# Patient Record
Sex: Male | Born: 1965 | Race: White | Hispanic: No | Smoking: Current some day smoker
Health system: Southern US, Community
[De-identification: ages and names within clinical notes are randomized; demographics above are authoritative.]

## PROBLEM LIST (undated history)

## (undated) DIAGNOSIS — K219 Gastro-esophageal reflux disease without esophagitis: Secondary | ICD-10-CM

## (undated) HISTORY — DX: Gastro-esophageal reflux disease without esophagitis: K21.9

## (undated) HISTORY — PX: LEG SURGERY: SHX1003

---

## 2016-07-15 DIAGNOSIS — Z96659 Presence of unspecified artificial knee joint: Secondary | ICD-10-CM | POA: Diagnosis not present

## 2016-07-15 DIAGNOSIS — T84018A Broken internal joint prosthesis, other site, initial encounter: Secondary | ICD-10-CM | POA: Diagnosis not present

## 2016-10-06 DIAGNOSIS — G8929 Other chronic pain: Secondary | ICD-10-CM | POA: Diagnosis not present

## 2016-10-06 DIAGNOSIS — Q7292 Unspecified reduction defect of left lower limb: Secondary | ICD-10-CM | POA: Diagnosis not present

## 2016-10-06 DIAGNOSIS — M25561 Pain in right knee: Secondary | ICD-10-CM | POA: Diagnosis not present

## 2016-10-06 DIAGNOSIS — M7541 Impingement syndrome of right shoulder: Secondary | ICD-10-CM | POA: Diagnosis not present

## 2016-10-06 DIAGNOSIS — M545 Low back pain: Secondary | ICD-10-CM | POA: Diagnosis not present

## 2016-12-07 DIAGNOSIS — Z23 Encounter for immunization: Secondary | ICD-10-CM | POA: Diagnosis not present

## 2017-10-27 DIAGNOSIS — M62838 Other muscle spasm: Secondary | ICD-10-CM | POA: Diagnosis not present

## 2017-10-27 DIAGNOSIS — M25561 Pain in right knee: Secondary | ICD-10-CM | POA: Diagnosis not present

## 2017-10-27 DIAGNOSIS — M6740 Ganglion, unspecified site: Secondary | ICD-10-CM | POA: Diagnosis not present

## 2017-10-27 DIAGNOSIS — M67432 Ganglion, left wrist: Secondary | ICD-10-CM | POA: Diagnosis not present

## 2017-10-27 DIAGNOSIS — M545 Low back pain: Secondary | ICD-10-CM | POA: Diagnosis not present

## 2017-10-27 DIAGNOSIS — Z7409 Other reduced mobility: Secondary | ICD-10-CM | POA: Diagnosis not present

## 2017-10-27 DIAGNOSIS — G8929 Other chronic pain: Secondary | ICD-10-CM | POA: Diagnosis not present

## 2017-11-17 DIAGNOSIS — M6740 Ganglion, unspecified site: Secondary | ICD-10-CM | POA: Diagnosis not present

## 2017-12-08 DIAGNOSIS — M6740 Ganglion, unspecified site: Secondary | ICD-10-CM | POA: Diagnosis not present

## 2017-12-13 DIAGNOSIS — Z23 Encounter for immunization: Secondary | ICD-10-CM | POA: Diagnosis not present

## 2018-08-29 ENCOUNTER — Other Ambulatory Visit: Payer: Self-pay | Admitting: Sports Medicine

## 2018-08-29 DIAGNOSIS — S83242A Other tear of medial meniscus, current injury, left knee, initial encounter: Secondary | ICD-10-CM | POA: Diagnosis not present

## 2018-08-29 DIAGNOSIS — S8001XA Contusion of right knee, initial encounter: Secondary | ICD-10-CM | POA: Diagnosis not present

## 2018-08-29 DIAGNOSIS — M25562 Pain in left knee: Secondary | ICD-10-CM

## 2018-09-23 ENCOUNTER — Other Ambulatory Visit: Payer: Self-pay

## 2018-12-07 DIAGNOSIS — Z1329 Encounter for screening for other suspected endocrine disorder: Secondary | ICD-10-CM | POA: Diagnosis not present

## 2018-12-07 DIAGNOSIS — I10 Essential (primary) hypertension: Secondary | ICD-10-CM | POA: Diagnosis not present

## 2018-12-07 DIAGNOSIS — Z125 Encounter for screening for malignant neoplasm of prostate: Secondary | ICD-10-CM | POA: Diagnosis not present

## 2018-12-07 DIAGNOSIS — M898X9 Other specified disorders of bone, unspecified site: Secondary | ICD-10-CM | POA: Diagnosis not present

## 2018-12-07 DIAGNOSIS — Z23 Encounter for immunization: Secondary | ICD-10-CM | POA: Diagnosis not present

## 2018-12-07 DIAGNOSIS — Z136 Encounter for screening for cardiovascular disorders: Secondary | ICD-10-CM | POA: Diagnosis not present

## 2018-12-07 DIAGNOSIS — Z131 Encounter for screening for diabetes mellitus: Secondary | ICD-10-CM | POA: Diagnosis not present

## 2018-12-18 DIAGNOSIS — Z0001 Encounter for general adult medical examination with abnormal findings: Secondary | ICD-10-CM | POA: Diagnosis not present

## 2018-12-18 DIAGNOSIS — M898X9 Other specified disorders of bone, unspecified site: Secondary | ICD-10-CM | POA: Diagnosis not present

## 2018-12-18 DIAGNOSIS — Z1389 Encounter for screening for other disorder: Secondary | ICD-10-CM | POA: Diagnosis not present

## 2018-12-18 DIAGNOSIS — I10 Essential (primary) hypertension: Secondary | ICD-10-CM | POA: Diagnosis not present

## 2018-12-26 DIAGNOSIS — I10 Essential (primary) hypertension: Secondary | ICD-10-CM | POA: Diagnosis not present

## 2018-12-26 DIAGNOSIS — M898X9 Other specified disorders of bone, unspecified site: Secondary | ICD-10-CM | POA: Diagnosis not present

## 2019-01-15 DIAGNOSIS — G8929 Other chronic pain: Secondary | ICD-10-CM | POA: Diagnosis not present

## 2019-01-15 DIAGNOSIS — M25561 Pain in right knee: Secondary | ICD-10-CM | POA: Diagnosis not present

## 2019-01-15 DIAGNOSIS — T84032A Mechanical loosening of internal right knee prosthetic joint, initial encounter: Secondary | ICD-10-CM | POA: Diagnosis not present

## 2019-01-15 DIAGNOSIS — T84039A Mechanical loosening of unspecified internal prosthetic joint, initial encounter: Secondary | ICD-10-CM | POA: Diagnosis not present

## 2019-01-16 DIAGNOSIS — T84032A Mechanical loosening of internal right knee prosthetic joint, initial encounter: Secondary | ICD-10-CM | POA: Insufficient documentation

## 2019-01-16 DIAGNOSIS — T84039A Mechanical loosening of unspecified internal prosthetic joint, initial encounter: Secondary | ICD-10-CM | POA: Insufficient documentation

## 2019-01-16 HISTORY — DX: Mechanical loosening of unspecified internal prosthetic joint, initial encounter: T84.039A

## 2019-01-16 HISTORY — DX: Mechanical loosening of internal right knee prosthetic joint, initial encounter: T84.032A

## 2019-03-19 DIAGNOSIS — I10 Essential (primary) hypertension: Secondary | ICD-10-CM | POA: Diagnosis not present

## 2019-03-19 DIAGNOSIS — M898X9 Other specified disorders of bone, unspecified site: Secondary | ICD-10-CM | POA: Diagnosis not present

## 2019-05-21 ENCOUNTER — Emergency Department (HOSPITAL_COMMUNITY)
Admission: EM | Admit: 2019-05-21 | Discharge: 2019-05-21 | Disposition: A | Discharge status: Home / Self Care | Payer: Medicare Other | Attending: Emergency Medicine | Admitting: Emergency Medicine

## 2019-05-21 ENCOUNTER — Encounter (HOSPITAL_COMMUNITY): Payer: Self-pay | Admitting: Emergency Medicine

## 2019-05-21 DIAGNOSIS — M545 Low back pain, unspecified: Secondary | ICD-10-CM

## 2019-05-21 DIAGNOSIS — R531 Weakness: Secondary | ICD-10-CM | POA: Diagnosis not present

## 2019-05-21 DIAGNOSIS — M5489 Other dorsalgia: Secondary | ICD-10-CM | POA: Diagnosis not present

## 2019-05-21 DIAGNOSIS — R52 Pain, unspecified: Secondary | ICD-10-CM | POA: Diagnosis not present

## 2019-05-21 MED ORDER — KETOROLAC TROMETHAMINE 60 MG/2ML IM SOLN
60.0000 mg | Freq: Once | INTRAMUSCULAR | Status: AC
  Administered 2019-05-21: 14:00:00 60 mg via INTRAMUSCULAR
  Filled 2019-05-21: qty 2

## 2019-05-21 MED ORDER — DICLOFENAC SODIUM 75 MG PO TBEC: 75.0000 mg | DELAYED_RELEASE_TABLET | Freq: Two times a day (BID) | ORAL | 0 refills | Status: DC

## 2019-05-21 MED ORDER — LORAZEPAM 2 MG/ML IJ SOLN
1.0000 mg | Freq: Once | INTRAMUSCULAR | Status: AC
  Administered 2019-05-21: 1 mg via INTRAMUSCULAR
  Filled 2019-05-21: qty 1

## 2019-05-21 MED ORDER — METHOCARBAMOL 500 MG PO TABS: 500.0000 mg | ORAL_TABLET | Freq: Four times a day (QID) | ORAL | 0 refills | Status: DC

## 2019-05-21 NOTE — Discharge Instructions (Signed)
Schedule to see the Orthopaedist for evaluation  

## 2019-05-21 NOTE — ED Triage Notes (Signed)
Per EMS, patient from home, reports worsening back pain since yesterday. Reports taking naprosyn with no relief. States using ice pack and pain worsened after ice. Reports weakness to lower legs. Hx chronic back pain.

## 2019-05-21 NOTE — ED Notes (Signed)
Pt verbalizes understanding of DC instructions. Pt belongings returned and is assisted in wheelchair out of ED.    

## 2019-05-21 NOTE — ED Provider Notes (Signed)
[facility] DEPT Provider Note   CSN: [id number] Arrival date & time: 05/21/19  1314     History Chief Complaint  Patient presents with  . Back Pain    Rajvir Ernster is a 54 y.o. male.  The history is provided by the patient. No language interpreter was used.  Back Pain Location:  Lumbar spine Quality:  Aching Radiates to:  Does not radiate Pain severity:  Severe Pain is:  Same all the time Onset quality:  Gradual Timing:  Constant Progression:  Worsening Chronicity:  New Relieved by:  Nothing Worsened by:  Nothing  Pt complains of pain in his back. Pt reports pain with walking     History reviewed. No pertinent past medical history.  There are no problems to display for this patient.   History reviewed. No pertinent surgical history.     No family history on file.  Social History   Tobacco Use  . Smoking status: Not on file  Substance Use Topics  . Alcohol use: Not on file  . Drug use: Not on file    Home Medications Prior to Admission medications   Not on File    Allergies    Patient has no known allergies.  Review of Systems   Review of Systems  Musculoskeletal: Positive for back pain.  All other systems reviewed and are negative.   Physical Exam Updated Vital Signs BP (!) 150/91   Pulse 87   Temp 98.5 F (36.9 C)   Resp 16   SpO2 97%   Physical Exam Vitals and nursing note reviewed.  Constitutional:      Appearance: He is well-developed.  HENT:     Head: Normocephalic and atraumatic.  Eyes:     Conjunctiva/sclera: Conjunctivae normal.  Cardiovascular:     Rate and Rhythm: Normal rate and regular rhythm.     Heart sounds: No murmur.  Pulmonary:     Effort: Pulmonary effort is normal. No respiratory distress.     Breath sounds: Normal breath sounds.  Abdominal:     Palpations: Abdomen is soft.     Tenderness: There is no abdominal tenderness.  Musculoskeletal:     Cervical back: Neck supple.    Skin:    General: Skin is warm and dry.  Neurological:     General: No focal deficit present.     Mental Status: He is alert.  Psychiatric:        Mood and Affect: Mood normal.     ED Results / Procedures / Treatments   Labs (all labs ordered are listed, but only abnormal results are displayed) Labs Reviewed - No data to display  EKG None  Radiology No results found.  Procedures Procedures (including critical care time)  Medications Ordered in ED Medications  ketorolac (TORADOL) injection 60 mg (60 mg Intramuscular Given 05/21/19 1411)  LORazepam (ATIVAN) injection 1 mg (1 mg Intramuscular Given 05/21/19 1411)    ED Course  I have reviewed the triage vital signs and the nursing notes.  Pertinent labs & imaging results that were available during my care of the patient were reviewed by me and considered in my medical decision making (see chart for details).    MDM Rules/Calculators/A&P                      MDM:  Pt feels better after injection of toradol and ativan.   I will treat with Robaxin and voltaren  Final Clinical Impression(s) /  ED Diagnoses Final diagnoses:  Acute low back pain without sciatica, unspecified back pain laterality   An After Visit Summary was printed and given to the patient.  Rx / DC Orders ED Discharge Orders    None       Osie Cheeks 05/21/19 1636    Cathren Laine, MD 05/21/19 1715

## 2019-05-23 DIAGNOSIS — M545 Low back pain: Secondary | ICD-10-CM | POA: Diagnosis not present

## 2019-05-24 ENCOUNTER — Other Ambulatory Visit: Payer: Self-pay | Admitting: Orthopedic Surgery

## 2019-05-24 DIAGNOSIS — M545 Low back pain, unspecified: Secondary | ICD-10-CM

## 2019-06-20 ENCOUNTER — Ambulatory Visit
Admission: RE | Admit: 2019-06-20 | Discharge: 2019-06-20 | Disposition: A | Discharge status: Home / Self Care | Payer: Medicare Other | Source: Ambulatory Visit | Attending: Orthopedic Surgery | Admitting: Orthopedic Surgery

## 2019-06-20 ENCOUNTER — Other Ambulatory Visit: Payer: Self-pay

## 2019-06-20 DIAGNOSIS — M545 Low back pain, unspecified: Secondary | ICD-10-CM

## 2019-06-20 DIAGNOSIS — M48061 Spinal stenosis, lumbar region without neurogenic claudication: Secondary | ICD-10-CM | POA: Diagnosis not present

## 2019-06-27 DIAGNOSIS — M545 Low back pain: Secondary | ICD-10-CM | POA: Diagnosis not present

## 2019-06-27 DIAGNOSIS — M4316 Spondylolisthesis, lumbar region: Secondary | ICD-10-CM | POA: Diagnosis not present

## 2019-11-27 DIAGNOSIS — R0989 Other specified symptoms and signs involving the circulatory and respiratory systems: Secondary | ICD-10-CM | POA: Diagnosis not present

## 2019-11-27 DIAGNOSIS — I10 Essential (primary) hypertension: Secondary | ICD-10-CM | POA: Diagnosis not present

## 2019-11-27 DIAGNOSIS — Z125 Encounter for screening for malignant neoplasm of prostate: Secondary | ICD-10-CM | POA: Diagnosis not present

## 2019-12-18 DIAGNOSIS — Z23 Encounter for immunization: Secondary | ICD-10-CM | POA: Diagnosis not present

## 2019-12-18 DIAGNOSIS — Z0001 Encounter for general adult medical examination with abnormal findings: Secondary | ICD-10-CM | POA: Diagnosis not present

## 2019-12-18 DIAGNOSIS — I10 Essential (primary) hypertension: Secondary | ICD-10-CM | POA: Diagnosis not present

## 2020-04-29 DIAGNOSIS — Z96651 Presence of right artificial knee joint: Secondary | ICD-10-CM

## 2020-04-29 DIAGNOSIS — L309 Dermatitis, unspecified: Secondary | ICD-10-CM | POA: Insufficient documentation

## 2020-04-29 DIAGNOSIS — G8929 Other chronic pain: Secondary | ICD-10-CM | POA: Insufficient documentation

## 2020-04-29 DIAGNOSIS — Z6841 Body Mass Index (BMI) 40.0 and over, adult: Secondary | ICD-10-CM | POA: Diagnosis not present

## 2020-04-29 DIAGNOSIS — M62562 Muscle wasting and atrophy, not elsewhere classified, left lower leg: Secondary | ICD-10-CM | POA: Insufficient documentation

## 2020-04-29 DIAGNOSIS — R042 Hemoptysis: Secondary | ICD-10-CM | POA: Diagnosis not present

## 2020-04-29 DIAGNOSIS — M25561 Pain in right knee: Secondary | ICD-10-CM | POA: Diagnosis not present

## 2020-04-29 DIAGNOSIS — M545 Low back pain, unspecified: Secondary | ICD-10-CM | POA: Diagnosis not present

## 2020-04-29 HISTORY — DX: Dermatitis, unspecified: L30.9

## 2020-04-29 HISTORY — DX: Presence of right artificial knee joint: Z96.651

## 2020-06-28 DIAGNOSIS — I10 Essential (primary) hypertension: Secondary | ICD-10-CM | POA: Insufficient documentation

## 2020-06-28 DIAGNOSIS — G894 Chronic pain syndrome: Secondary | ICD-10-CM | POA: Insufficient documentation

## 2020-07-04 DIAGNOSIS — H6502 Acute serous otitis media, left ear: Secondary | ICD-10-CM | POA: Diagnosis not present

## 2020-07-05 ENCOUNTER — Encounter (HOSPITAL_COMMUNITY): Payer: Self-pay | Admitting: Emergency Medicine

## 2020-07-05 ENCOUNTER — Other Ambulatory Visit: Payer: Self-pay

## 2020-07-05 ENCOUNTER — Emergency Department (HOSPITAL_COMMUNITY): Payer: Medicare HMO

## 2020-07-05 ENCOUNTER — Emergency Department (HOSPITAL_COMMUNITY)
Admission: EM | Admit: 2020-07-05 | Discharge: 2020-07-05 | Disposition: A | Discharge status: Home / Self Care | Payer: Medicare HMO | Attending: Emergency Medicine | Admitting: Emergency Medicine

## 2020-07-05 DIAGNOSIS — J069 Acute upper respiratory infection, unspecified: Secondary | ICD-10-CM | POA: Diagnosis not present

## 2020-07-05 DIAGNOSIS — Z20822 Contact with and (suspected) exposure to covid-19: Secondary | ICD-10-CM | POA: Diagnosis not present

## 2020-07-05 DIAGNOSIS — R059 Cough, unspecified: Secondary | ICD-10-CM | POA: Diagnosis not present

## 2020-07-05 DIAGNOSIS — R9431 Abnormal electrocardiogram [ECG] [EKG]: Secondary | ICD-10-CM | POA: Diagnosis not present

## 2020-07-05 LAB — RESP PANEL BY RT-PCR (FLU A&B, COVID) ARPGX2
Influenza A by PCR: NEGATIVE
Influenza B by PCR: NEGATIVE
SARS Coronavirus 2 by RT PCR: NEGATIVE

## 2020-07-05 MED ORDER — BENZONATATE 100 MG PO CAPS: 100.0000 mg | ORAL_CAPSULE | Freq: Three times a day (TID) | ORAL | 0 refills | Status: DC | PRN

## 2020-07-05 NOTE — ED Notes (Signed)
Pt requested to use the restroom to have a bowel movement, but required assistance to the restroom. Pt refused to use the bedside commode. Pt had work of breathing while traveling down to the restroom and required a wheelchair to get back to the room. The bedside commode was presented for the next instance of a bowel movement. This EMT insured that the call light was in working order upon returning pt to bed.

## 2020-07-05 NOTE — ED Provider Notes (Signed)
[facility] EMERGENCY DEPARTMENT Provider Note   CSN: [id number] Arrival date & time: 07/05/20  0756     History Chief Complaint  Patient presents with  . Cough    Alexander Black is a 55 y.o. male significant past medical history, presenting for evaluation of cough and throat discomfort.  Symptoms began yesterday, he was evaluated at urgent care for serous otitis media.  At that time he.  Strep negative.  COVID swab was not sent.  States he has received vaccinations for COVID-19.  Symptoms include postnasal drip and mucus in his throat that he is having quite a bit of trouble clearing.  He has nasal congestion.  Cough is dry and nonproductive.  He feels when he wakes up he feels like he is choking secretions.  He use the nasal spray and took a dose of the Augmentin without relief, he presents for evaluation.  The history is provided by the patient and medical records.       History reviewed. No pertinent past medical history.  There are no problems to display for this patient.   History reviewed. No pertinent surgical history.     No family history on file.  Social History   Tobacco Use  . Smoking status: Never Smoker  . Smokeless tobacco: Never Used  Substance Use Topics  . Alcohol use: Not Currently  . Drug use: Not Currently    Home Medications Prior to Admission medications   Medication Sig Start Date End Date Taking? Authorizing Provider  benzonatate (TESSALON) 100 MG capsule Take 1 capsule (100 mg total) by mouth 3 (three) times daily as needed for cough. 07/05/20  Yes Marticia Reifschneider, [place] N, PA-C  diclofenac (VOLTAREN) 75 MG EC tablet Take 1 tablet (75 mg total) by mouth 2 (two) times daily. 05/21/19   Elson Areas, PA-C  methocarbamol (ROBAXIN) 500 MG tablet Take 1 tablet (500 mg total) by mouth 4 (four) times daily. 05/21/19   Elson Areas, PA-C    Allergies    Patient has no known allergies.  Review of Systems   Review of Systems   Constitutional: Negative for fever.  HENT: Positive for congestion, postnasal drip and sore throat. Negative for ear discharge and ear pain.   Respiratory: Positive for cough.   All other systems reviewed and are negative.   Physical Exam Updated Vital Signs BP (!) 130/93 (BP Location: Right Arm)   Pulse 88   Temp 98.8 F (37.1 C) (Oral)   Resp 18   SpO2 97%   Physical Exam Vitals and nursing note reviewed.  Constitutional:      General: He is not in acute distress.    Appearance: He is well-developed. He is obese. He is not ill-appearing.  HENT:     Head: Normocephalic and atraumatic.     Right Ear: Tympanic membrane, ear canal and external ear normal.     Left Ear: Tympanic membrane, ear canal and external ear normal.     Nose: Congestion present.     Mouth/Throat:     Mouth: Mucous membranes are moist.     Pharynx: Oropharynx is clear.     Comments: No hot potato voice, no trismus, no drooling.  Mild posterior pharyngeal erythema, no exudates or edema.  Uvula is midline. Eyes:     Conjunctiva/sclera: Conjunctivae normal.  Cardiovascular:     Rate and Rhythm: Normal rate and regular rhythm.  Pulmonary:     Effort: Pulmonary effort is normal. No respiratory distress.  Breath sounds: Normal breath sounds. No stridor.     Comments: Speaking in full sentences with normal work of breathing. Abdominal:     Palpations: Abdomen is soft.  Musculoskeletal:     Cervical back: Normal range of motion and neck supple. No tenderness.  Lymphadenopathy:     Cervical: No cervical adenopathy.  Skin:    General: Skin is warm.  Neurological:     Mental Status: He is alert.  Psychiatric:        Behavior: Behavior normal.     ED Results / Procedures / Treatments   Labs (all labs ordered are listed, but only abnormal results are displayed) Labs Reviewed  RESP PANEL BY RT-PCR (FLU A&B, COVID) [id number]    EKG EKG Interpretation  Date/Time:  Saturday July 05 2020 08:04:31  EDT Ventricular Rate:  93 PR Interval:  146 QRS Duration: 98 QT Interval:  348 QTC Calculation: 432 R Axis:   21 Text Interpretation: Normal sinus rhythm Normal ECG No old tracing to compare Confirmed by Meridee Score [phone]) on 07/05/2020 8:31:50 AM   Radiology DG Chest 2 View  Result Date: 07/05/2020 CLINICAL DATA:  55 year old male with cough. Questionable right lung base opacity on earlier portable chest. EXAM: CHEST - 2 VIEW COMPARISON:  Portable chest 0834 hours today. Chest radiographs 04/29/2020. FINDINGS: Stable lung volumes since February, improved on the lateral today. Normal cardiac size and mediastinal contours. Visualized tracheal air column is within normal limits. Both lungs appear stable since February and clear. No pneumothorax or pleural effusion. Substantial lumbar scoliosis. Straightening of thoracic kyphosis. Negative visible bowel gas pattern. IMPRESSION: 1.  No cardiopulmonary abnormality. 2. Scoliosis. Electronically Signed   By: Odessa Fleming M.D.   On: 07/05/2020 10:33   DG Chest Port 1 View  Result Date: 07/05/2020 CLINICAL DATA:  55 year old with a cough. EXAM: PORTABLE CHEST 1 VIEW COMPARISON:  04/29/2020 FINDINGS: AP portable views of the chest were obtained. Increased densities along the medial right lung base compared to the previous examination and not clear if this is related to patient positioning and projection. Upper lungs are clear. Overall heart size is stable. IMPRESSION: Study has limitations. There may be increased densities at the medial right lung base that are nonspecific. If possible, consider repeat imaging with PA and lateral views of the chest. Electronically Signed   By: Richarda Overlie M.D.   On: 07/05/2020 09:03    Procedures Procedures   Medications Ordered in ED Medications - No data to display  ED Course  I have reviewed the triage vital signs and the nursing notes.  Pertinent labs & imaging results that were available during my care of the  patient were reviewed by me and considered in my medical decision making (see chart for details).    MDM Rules/Calculators/A&P                          Patient here with symptoms consistent with URI.  Feels like the phlegm in his throat is causing him to "suffocate," however he is speaking in full sentences with normal work of breathing, no drooling, pharynx is clear.  Airway is patent.  Provided reassurance, mucus can make 1 feel that way, however it is important to hydrate to help keep patients thin.  Also recommend Mucinex for mucolytic and decongestant.  He is tested for COVID and flu here.  His ENT exam today is overall unremarkable.  His lungs are clear.  Chest x-ray  is clear. His O2 saturation is excellent on room air, at 97%.  His vital signs are reassuring.  He is afebrile.  Provided reassurance, instructed symptomatic management, throat lozenges, or throat sprays, Mucinex, hydration, continue Flonase.  Follow-up if symptoms do not improve.  Discussed results, findings, treatment and follow up. Patient advised of return precautions. Patient verbalized understanding and agreed with plan.  Final Clinical Impression(s) / ED Diagnoses Final diagnoses:  Upper respiratory tract infection, unspecified type    Rx / DC Orders ED Discharge Orders         Ordered    benzonatate (TESSALON) 100 MG capsule  3 times daily PRN        07/05/20 1043           Rayhana Slider, [place] N, [place] 07/05/20 1043    Rolan Bucco, MD 07/05/20 1116

## 2020-07-05 NOTE — ED Triage Notes (Signed)
Pt reports productive cough with clear sputum.  States he was seen yesterday for same and started using nasal spray and taking antibiotic without relief.  Woke up this morning feeling like he was choking and had SOB.  Denies SOB and pain at present.

## 2020-07-05 NOTE — ED Notes (Signed)
Pt returned from X-ray.  

## 2020-07-05 NOTE — ED Notes (Signed)
Pt transported to Xray. 

## 2020-07-05 NOTE — Discharge Instructions (Addendum)
Your chest X ray is normal. Please read instructions below.  You can alternate Tylenol/acetaminophen and Advil/ibuprofen/Motrin every 4 hours for sore throat, body aches, headache or fever.  Drink plenty of water.  Use the  nasal spray for congestion. You can take Tessalon every 8 hours as needed for cough. Take mucinex as directed to help thin your mucus. You can find this over-the-counter. You can also use a throat spray to help with throat irritation. This is over-the-counter as well.  Wash your hands frequently. You have a COVID test pending. Please isolate at home while awaiting your results.  You can follow your results on MyChart. > If your test is negative, stay home until your fever has resolved/your symptoms are improving. > If your test is positive, isolate at home for at least 5 days after the day your symptoms initially began, and THEN at least 24 hours after you are fever-free without the help of medications AND your symptoms are improving. Only once your symptoms are improving and you are fever-free can you come out out of quarantine. Once, then you should wear a mask in public for another 5 days. Follow up with your primary care provider. Return to the ER for significant shortness of breath, uncontrollable vomiting, severe chest pain, or other concerning symptoms.

## 2020-07-13 ENCOUNTER — Ambulatory Visit (INDEPENDENT_AMBULATORY_CARE_PROVIDER_SITE_OTHER): Payer: Medicare HMO

## 2020-07-13 ENCOUNTER — Encounter (HOSPITAL_COMMUNITY): Payer: Self-pay

## 2020-07-13 ENCOUNTER — Ambulatory Visit (INDEPENDENT_AMBULATORY_CARE_PROVIDER_SITE_OTHER)
Admission: EM | Admit: 2020-07-13 | Discharge: 2020-07-13 | Disposition: A | Discharge status: Home / Self Care | Payer: Medicare HMO | Source: Home / Self Care

## 2020-07-13 ENCOUNTER — Other Ambulatory Visit: Payer: Self-pay

## 2020-07-13 DIAGNOSIS — J45909 Unspecified asthma, uncomplicated: Secondary | ICD-10-CM | POA: Diagnosis present

## 2020-07-13 DIAGNOSIS — R058 Other specified cough: Secondary | ICD-10-CM | POA: Insufficient documentation

## 2020-07-13 DIAGNOSIS — Z8249 Family history of ischemic heart disease and other diseases of the circulatory system: Secondary | ICD-10-CM | POA: Diagnosis not present

## 2020-07-13 DIAGNOSIS — J189 Pneumonia, unspecified organism: Secondary | ICD-10-CM | POA: Diagnosis not present

## 2020-07-13 DIAGNOSIS — R1310 Dysphagia, unspecified: Secondary | ICD-10-CM | POA: Diagnosis not present

## 2020-07-13 DIAGNOSIS — Z20822 Contact with and (suspected) exposure to covid-19: Secondary | ICD-10-CM | POA: Diagnosis present

## 2020-07-13 DIAGNOSIS — R0602 Shortness of breath: Secondary | ICD-10-CM | POA: Diagnosis not present

## 2020-07-13 DIAGNOSIS — E876 Hypokalemia: Secondary | ICD-10-CM | POA: Diagnosis present

## 2020-07-13 DIAGNOSIS — Z79899 Other long term (current) drug therapy: Secondary | ICD-10-CM | POA: Diagnosis not present

## 2020-07-13 DIAGNOSIS — F410 Panic disorder [episodic paroxysmal anxiety] without agoraphobia: Secondary | ICD-10-CM | POA: Diagnosis present

## 2020-07-13 DIAGNOSIS — R0989 Other specified symptoms and signs involving the circulatory and respiratory systems: Secondary | ICD-10-CM | POA: Diagnosis not present

## 2020-07-13 DIAGNOSIS — I10 Essential (primary) hypertension: Secondary | ICD-10-CM | POA: Diagnosis not present

## 2020-07-13 DIAGNOSIS — R000 Tachycardia, unspecified: Secondary | ICD-10-CM | POA: Diagnosis not present

## 2020-07-13 DIAGNOSIS — R0600 Dyspnea, unspecified: Secondary | ICD-10-CM | POA: Diagnosis not present

## 2020-07-13 DIAGNOSIS — R059 Cough, unspecified: Secondary | ICD-10-CM

## 2020-07-13 DIAGNOSIS — R1319 Other dysphagia: Secondary | ICD-10-CM | POA: Diagnosis not present

## 2020-07-13 DIAGNOSIS — R1312 Dysphagia, oropharyngeal phase: Secondary | ICD-10-CM | POA: Diagnosis not present

## 2020-07-13 DIAGNOSIS — J9811 Atelectasis: Secondary | ICD-10-CM | POA: Diagnosis present

## 2020-07-13 DIAGNOSIS — R0689 Other abnormalities of breathing: Secondary | ICD-10-CM | POA: Diagnosis not present

## 2020-07-13 DIAGNOSIS — R0902 Hypoxemia: Secondary | ICD-10-CM | POA: Diagnosis not present

## 2020-07-13 DIAGNOSIS — J040 Acute laryngitis: Secondary | ICD-10-CM | POA: Diagnosis present

## 2020-07-13 DIAGNOSIS — J9601 Acute respiratory failure with hypoxia: Secondary | ICD-10-CM | POA: Diagnosis present

## 2020-07-13 DIAGNOSIS — E669 Obesity, unspecified: Secondary | ICD-10-CM | POA: Diagnosis present

## 2020-07-13 LAB — CBC WITH DIFFERENTIAL/PLATELET
Abs Immature Granulocytes: 0.02 10*3/uL (ref 0.00–0.07)
Basophils Absolute: 0 10*3/uL (ref 0.0–0.1)
Basophils Relative: 1 %
Eosinophils Absolute: 0 10*3/uL (ref 0.0–0.5)
Eosinophils Relative: 1 %
HCT: 42.3 % (ref 39.0–52.0)
Hemoglobin: 15 g/dL (ref 13.0–17.0)
Immature Granulocytes: 0 %
Lymphocytes Relative: 41 %
Lymphs Abs: 2 10*3/uL (ref 0.7–4.0)
MCH: 27.4 pg (ref 26.0–34.0)
MCHC: 35.5 g/dL (ref 30.0–36.0)
MCV: 77.3 fL — ABNORMAL LOW (ref 80.0–100.0)
Monocytes Absolute: 0.3 10*3/uL (ref 0.1–1.0)
Monocytes Relative: 6 %
Neutro Abs: 2.6 10*3/uL (ref 1.7–7.7)
Neutrophils Relative %: 51 %
Platelets: 303 10*3/uL (ref 150–400)
RBC: 5.47 MIL/uL (ref 4.22–5.81)
RDW: 14.5 % (ref 11.5–15.5)
WBC: 4.9 10*3/uL (ref 4.0–10.5)
nRBC: 0 % (ref 0.0–0.2)

## 2020-07-13 LAB — COMPREHENSIVE METABOLIC PANEL
ALT: 24 U/L (ref 0–44)
AST: 22 U/L (ref 15–41)
Albumin: 3.8 g/dL (ref 3.5–5.0)
Alkaline Phosphatase: 69 U/L (ref 38–126)
Anion gap: 5 (ref 5–15)
BUN: 8 mg/dL (ref 6–20)
CO2: 25 mmol/L (ref 22–32)
Calcium: 9.2 mg/dL (ref 8.9–10.3)
Chloride: 106 mmol/L (ref 98–111)
Creatinine, Ser: 0.79 mg/dL (ref 0.61–1.24)
GFR, Estimated: >60 mL/min (ref >60)
Glucose, Bld: 107 mg/dL — ABNORMAL HIGH (ref 70–99)
Potassium: 4.3 mmol/L (ref 3.5–5.1)
Sodium: 136 mmol/L (ref 135–145)
Total Bilirubin: 0.7 mg/dL (ref 0.3–1.2)
Total Protein: 7.4 g/dL (ref 6.5–8.1)

## 2020-07-13 MED ORDER — PROMETHAZINE-DM 6.25-15 MG/5ML PO SYRP: 5.0000 mL | ORAL_SOLUTION | Freq: Three times a day (TID) | ORAL | 0 refills | Status: DC | PRN

## 2020-07-13 MED ORDER — CEFUROXIME AXETIL 500 MG PO TABS: 500.0000 mg | ORAL_TABLET | Freq: Two times a day (BID) | ORAL | 0 refills | Status: DC

## 2020-07-13 MED ORDER — DOXYCYCLINE HYCLATE 100 MG PO CAPS: 100.0000 mg | ORAL_CAPSULE | Freq: Two times a day (BID) | ORAL | 0 refills | Status: DC

## 2020-07-13 NOTE — ED Provider Notes (Signed)
[facility]    CSN: [id number] Arrival date & time: 07/13/20  1028      History   Chief Complaint Chief Complaint  Patient presents with  . Diarrhea  . Cough    HPI Alexander Black is a 55 y.o. male.   Patient presents today with a several week history of cough.  He was seen in the emergency room 07/05/2020 at which point chest x-ray was normal and respiratory panel was negative.  He was given amoxicillin and will take last doses of this today.  Despite medication he continues to have significant symptoms.  Reports nocturnal symptoms that wake him up with severe cough and shortness of breath as if something is stuck in the back of his throat.  He denies any fever but does report occasional chills.  Reports some sinus congestion/chest congestion.  Denies any chest pain, palpitations, dizziness, syncope, nausea, vomiting.  He has been taking Mucinex D and Robitussin over-the-counter which improved but did not resolve symptoms.  Reports symptoms are interfering with ability perform daily activities.  He denies history of allergies, asthma, sleep apnea, smoking.     History reviewed. No pertinent past medical history.  There are no problems to display for this patient.   History reviewed. No pertinent surgical history.     Home Medications    Prior to Admission medications   Medication Sig Start Date End Date Taking? Authorizing Provider  cefUROXime (CEFTIN) 500 MG tablet Take 1 tablet (500 mg total) by mouth 2 (two) times daily with a meal. 07/13/20  Yes Evelynne Spiers K, PA-C  doxycycline (VIBRAMYCIN) 100 MG capsule Take 1 capsule (100 mg total) by mouth 2 (two) times daily. 07/13/20  Yes Lakeith Careaga K, PA-C  promethazine-dextromethorphan (PROMETHAZINE-DM) 6.25-15 MG/5ML syrup Take 5 mLs by mouth 3 (three) times daily as needed for cough. 07/13/20  Yes Marlicia Sroka, Noberto Retort, PA-C  pseudoephedrine-guaifenesin (MUCINEX D) 60-600 MG 12 hr tablet Take 1 tablet by mouth every 12 (twelve)  hours.   Yes [provider]  benzonatate (TESSALON) 100 MG capsule Take 1 capsule (100 mg total) by mouth 3 (three) times daily as needed for cough. 07/05/20   Robinson, [place] N, PA-C  diclofenac (VOLTAREN) 75 MG EC tablet Take 1 tablet (75 mg total) by mouth 2 (two) times daily. 05/21/19   Elson Areas, PA-C  methocarbamol (ROBAXIN) 500 MG tablet Take 1 tablet (500 mg total) by mouth 4 (four) times daily. 05/21/19   Elson Areas, PA-C    Family History History reviewed. No pertinent family history.  Social History Social History   Tobacco Use  . Smoking status: Never Smoker  . Smokeless tobacco: Never Used  Substance Use Topics  . Alcohol use: Not Currently  . Drug use: Not Currently     Allergies   Patient has no known allergies.   Review of Systems Review of Systems  Constitutional: Positive for activity change and chills. Negative for appetite change, fatigue and fever.  HENT: Negative for congestion, rhinorrhea, sinus pressure and sinus pain.   Respiratory: Positive for cough, chest tightness and shortness of breath. Negative for wheezing.   Cardiovascular: Negative for chest pain.  Gastrointestinal: Negative for abdominal pain, diarrhea, nausea and vomiting.  Musculoskeletal: Negative for arthralgias and myalgias.  Neurological: Negative for dizziness, light-headedness and headaches.     Physical Exam Triage Vital Signs ED Triage Vitals  Enc Vitals Group     BP 07/13/20 1109 129/84     Pulse Rate  07/13/20 1109 92     Resp 07/13/20 1109 (!) 22     Temp 07/13/20 1109 98.4 F (36.9 C)     Temp Source 07/13/20 1109 Oral     SpO2 07/13/20 1109 95 %     Weight --      Height --      Head Circumference --      Peak Flow --      Pain Score 07/13/20 1107 0     Pain Loc --      Pain Edu? --      Excl. in GC? --    No data found.  Updated Vital Signs BP 129/84 (BP Location: Left Wrist)   Pulse 92   Temp 98.4 F (36.9 C) (Oral)   Resp (!) 22    SpO2 95%   Visual Acuity Right Eye Distance:   Left Eye Distance:   Bilateral Distance:    Right Eye Near:   Left Eye Near:    Bilateral Near:     Physical Exam Vitals reviewed.  Constitutional:      General: He is awake.     Appearance: Normal appearance. He is normal weight. He is not ill-appearing.     Comments: Very pleasant male appears stated age in no acute distress  HENT:     Head: Normocephalic and atraumatic.     Right Ear: Tympanic membrane, ear canal and external ear normal. Tympanic membrane is not erythematous or bulging.     Left Ear: Tympanic membrane, ear canal and external ear normal. Tympanic membrane is not erythematous or bulging.     Nose: Nose normal.     Mouth/Throat:     Pharynx: Uvula midline. No oropharyngeal exudate or posterior oropharyngeal erythema.     Comments: Drainage present posterior oropharynx Cardiovascular:     Rate and Rhythm: Normal rate and regular rhythm.     Heart sounds: No murmur heard.   Pulmonary:     Effort: Pulmonary effort is normal. No accessory muscle usage or respiratory distress.     Breath sounds: Normal breath sounds. No stridor. No wheezing, rhonchi or rales.     Comments: Clear to auscultation bilaterally Abdominal:     General: Bowel sounds are normal.     Palpations: Abdomen is soft.     Tenderness: There is no abdominal tenderness.  Lymphadenopathy:     Head:     Right side of head: No submental, submandibular or tonsillar adenopathy.     Left side of head: No submental, submandibular or tonsillar adenopathy.     Cervical: No cervical adenopathy.  Neurological:     Mental Status: He is alert.  Psychiatric:        Behavior: Behavior is cooperative.      UC Treatments / Results  Labs (all labs ordered are listed, but only abnormal results are displayed) Labs Reviewed  CBC WITH DIFFERENTIAL/PLATELET  COMPREHENSIVE METABOLIC PANEL    EKG   Radiology DG Chest 2 View  Result Date:  07/13/2020 CLINICAL DATA:  Persistent cough and dyspnea for 10 days, on antibiotic therapy EXAM: CHEST - 2 VIEW COMPARISON:  07/05/2020 chest radiograph. FINDINGS: Stable cardiomediastinal silhouette with normal heart size. No pneumothorax. No pleural effusion. No pulmonary edema. Increased mild patchy opacity overlying the lower thoracic spine on the lateral view suggesting lower lobe pneumonia, probably right lower lobe. IMPRESSION: Increased mild patchy opacity overlying the lower thoracic spine on the lateral view, favor mild right lower lobe pneumonia. Chest  radiograph follow-up advised. Electronically Signed   By: Delbert Phenix M.D.   On: 07/13/2020 12:05    Procedures Procedures (including critical care time)  Medications Ordered in UC Medications - No data to display  Initial Impression / Assessment and Plan / UC Course  I have reviewed the triage vital signs and the nursing notes.  Pertinent labs & imaging results that were available during my care of the patient were reviewed by me and considered in my medical decision making (see chart for details).     X-ray showed patchy infiltrates concerning for mild RLL pneumonia.  We will start patient on doxycycline and cefuroxime.  He was prescribed Promethazine DM with instruction not to drive or drink alcohol with this medication as drowsiness is a common side effect.  CBC and CMP obtained today-results pending.  No indication for hospitalization based on vital signs and patient history at this time but discussed if he has any worsening symptoms he is to go to the emergency room.  Discussed the importance of follow-up with primary care including repeat chest x-ray in a few weeks to which patient expressed understanding.  Reports he has a primary care provider and will call them Monday to schedule an appointment.  Strict return precautions given to which patient expressed understanding.  Final Clinical Impressions(s) / UC Diagnoses   Final  diagnoses:  Pneumonia of right lower lobe due to infectious organism  Productive cough     Discharge Instructions     Your x-ray shows a beginning of a possible pneumonia.  Please take doxycycline and cefuroxime twice daily for 10 days.  This can upset your stomach so please make sure you take it with food.  Gargle with warm salt water to help with congestion.  You can use Mucinex.  I have prescribed Promethazine DM for cough and congestion.  This will make you sleepy so please do not drive or drink alcohol while taking this.  It is important that we repeat your chest x-ray in a few weeks so please follow-up with your primary care provider as we discussed.  If you have any worsening symptoms including chest pain, shortness of breath, worsening cough, fever, nausea/vomiting you need to go to the emergency room.    ED Prescriptions    Medication Sig Dispense Auth. Provider   promethazine-dextromethorphan (PROMETHAZINE-DM) 6.25-15 MG/5ML syrup Take 5 mLs by mouth 3 (three) times daily as needed for cough. 118 mL Aleyda Gindlesperger K, PA-C   doxycycline (VIBRAMYCIN) 100 MG capsule Take 1 capsule (100 mg total) by mouth 2 (two) times daily. 20 capsule Lundyn Coste K, PA-C   cefUROXime (CEFTIN) 500 MG tablet Take 1 tablet (500 mg total) by mouth 2 (two) times daily with a meal. 20 tablet Edilia Ghuman K, PA-C     PDMP not reviewed this encounter.   Jeani Hawking, PA-C 07/13/20 1232

## 2020-07-13 NOTE — ED Triage Notes (Signed)
Pt reports cough, chest congestion and diarrhea x 10 days. Robitussin gives relief. Pt reports he is taking amoxicillin, tomorrow ill be lats dose.

## 2020-07-13 NOTE — Discharge Instructions (Signed)
Your x-ray shows a beginning of a possible pneumonia.  Please take doxycycline and cefuroxime twice daily for 10 days.  This can upset your stomach so please make sure you take it with food.  Gargle with warm salt water to help with congestion.  You can use Mucinex.  I have prescribed Promethazine DM for cough and congestion.  This will make you sleepy so please do not drive or drink alcohol while taking this.  It is important that we repeat your chest x-ray in a few weeks so please follow-up with your primary care provider as we discussed.  If you have any worsening symptoms including chest pain, shortness of breath, worsening cough, fever, nausea/vomiting you need to go to the emergency room.

## 2020-07-15 ENCOUNTER — Emergency Department (HOSPITAL_COMMUNITY): Payer: Medicare HMO

## 2020-07-15 ENCOUNTER — Inpatient Hospital Stay (HOSPITAL_COMMUNITY)
Admission: EM | Admit: 2020-07-15 | Discharge: 2020-07-20 | DRG: 189 | Disposition: A | Discharge status: Home / Self Care | Payer: Medicare HMO | Attending: Family Medicine | Admitting: Family Medicine

## 2020-07-15 ENCOUNTER — Encounter (HOSPITAL_COMMUNITY): Payer: Self-pay

## 2020-07-15 ENCOUNTER — Other Ambulatory Visit: Payer: Self-pay

## 2020-07-15 DIAGNOSIS — R1312 Dysphagia, oropharyngeal phase: Secondary | ICD-10-CM | POA: Diagnosis not present

## 2020-07-15 DIAGNOSIS — J9601 Acute respiratory failure with hypoxia: Secondary | ICD-10-CM | POA: Diagnosis present

## 2020-07-15 DIAGNOSIS — J45909 Unspecified asthma, uncomplicated: Secondary | ICD-10-CM | POA: Diagnosis present

## 2020-07-15 DIAGNOSIS — Z20822 Contact with and (suspected) exposure to covid-19: Secondary | ICD-10-CM | POA: Diagnosis present

## 2020-07-15 DIAGNOSIS — J9811 Atelectasis: Secondary | ICD-10-CM | POA: Diagnosis present

## 2020-07-15 DIAGNOSIS — R000 Tachycardia, unspecified: Secondary | ICD-10-CM

## 2020-07-15 DIAGNOSIS — J040 Acute laryngitis: Secondary | ICD-10-CM | POA: Diagnosis present

## 2020-07-15 DIAGNOSIS — R1310 Dysphagia, unspecified: Secondary | ICD-10-CM

## 2020-07-15 DIAGNOSIS — Z8249 Family history of ischemic heart disease and other diseases of the circulatory system: Secondary | ICD-10-CM

## 2020-07-15 DIAGNOSIS — F410 Panic disorder [episodic paroxysmal anxiety] without agoraphobia: Secondary | ICD-10-CM | POA: Diagnosis present

## 2020-07-15 DIAGNOSIS — Z79899 Other long term (current) drug therapy: Secondary | ICD-10-CM

## 2020-07-15 DIAGNOSIS — E876 Hypokalemia: Secondary | ICD-10-CM | POA: Diagnosis present

## 2020-07-15 DIAGNOSIS — E669 Obesity, unspecified: Secondary | ICD-10-CM | POA: Diagnosis present

## 2020-07-15 DIAGNOSIS — R059 Cough, unspecified: Secondary | ICD-10-CM

## 2020-07-15 DIAGNOSIS — R1319 Other dysphagia: Secondary | ICD-10-CM | POA: Diagnosis not present

## 2020-07-15 HISTORY — DX: Acute respiratory failure with hypoxia: J96.01

## 2020-07-15 LAB — CBC WITH DIFFERENTIAL/PLATELET
Abs Immature Granulocytes: 0.04 10*3/uL (ref 0.00–0.07)
Basophils Absolute: 0 10*3/uL (ref 0.0–0.1)
Basophils Relative: 0 %
Eosinophils Absolute: 0.2 10*3/uL (ref 0.0–0.5)
Eosinophils Relative: 2 %
HCT: 42.4 % (ref 39.0–52.0)
Hemoglobin: 14.6 g/dL (ref 13.0–17.0)
Immature Granulocytes: 1 %
Lymphocytes Relative: 53 %
Lymphs Abs: 4.2 10*3/uL — ABNORMAL HIGH (ref 0.7–4.0)
MCH: 27.1 pg (ref 26.0–34.0)
MCHC: 34.4 g/dL (ref 30.0–36.0)
MCV: 78.7 fL — ABNORMAL LOW (ref 80.0–100.0)
Monocytes Absolute: 0.7 10*3/uL (ref 0.1–1.0)
Monocytes Relative: 9 %
Neutro Abs: 2.8 10*3/uL (ref 1.7–7.7)
Neutrophils Relative %: 35 %
Platelets: 282 10*3/uL (ref 150–400)
RBC: 5.39 MIL/uL (ref 4.22–5.81)
RDW: 15.1 % (ref 11.5–15.5)
WBC: 8 10*3/uL (ref 4.0–10.5)
nRBC: 0 % (ref 0.0–0.2)

## 2020-07-15 LAB — BASIC METABOLIC PANEL
Anion gap: 9 (ref 5–15)
BUN: 12 mg/dL (ref 6–20)
CO2: 22 mmol/L (ref 22–32)
Calcium: 8.7 mg/dL — ABNORMAL LOW (ref 8.9–10.3)
Chloride: 101 mmol/L (ref 98–111)
Creatinine, Ser: 0.72 mg/dL (ref 0.61–1.24)
GFR, Estimated: >60 mL/min (ref >60)
Glucose, Bld: 117 mg/dL — ABNORMAL HIGH (ref 70–99)
Potassium: 3.3 mmol/L — ABNORMAL LOW (ref 3.5–5.1)
Sodium: 132 mmol/L — ABNORMAL LOW (ref 135–145)

## 2020-07-15 LAB — RESP PANEL BY RT-PCR (FLU A&B, COVID) ARPGX2
Influenza A by PCR: NEGATIVE
Influenza B by PCR: NEGATIVE
SARS Coronavirus 2 by RT PCR: NEGATIVE

## 2020-07-15 LAB — LACTIC ACID, PLASMA
Lactic Acid, Venous: 1.7 mmol/L (ref 0.5–1.9)
Lactic Acid, Venous: 3 mmol/L (ref 0.5–1.9)

## 2020-07-15 LAB — MAGNESIUM: Magnesium: 2 mg/dL (ref 1.7–2.4)

## 2020-07-15 LAB — BRAIN NATRIURETIC PEPTIDE: B Natriuretic Peptide: 17.6 pg/mL (ref 0.0–100.0)

## 2020-07-15 LAB — TROPONIN I (HIGH SENSITIVITY)
Troponin I (High Sensitivity): 2 ng/L (ref <18)
Troponin I (High Sensitivity): 2 ng/L (ref <18)

## 2020-07-15 LAB — D-DIMER, QUANTITATIVE: D-Dimer, Quant: 0.51 ug/mL-FEU — ABNORMAL HIGH (ref 0.00–0.50)

## 2020-07-15 MED ORDER — SODIUM CHLORIDE 0.9 % IV BOLUS
1000.0000 mL | Freq: Once | INTRAVENOUS | Status: AC
  Administered 2020-07-15: 1000 mL via INTRAVENOUS

## 2020-07-15 MED ORDER — DEXTROMETHORPHAN POLISTIREX ER 30 MG/5ML PO SUER
30.0000 mg | Freq: Two times a day (BID) | ORAL | Status: DC
  Administered 2020-07-15 – 2020-07-16 (×3): 30 mg via ORAL
  Filled 2020-07-15 (×3): qty 5

## 2020-07-15 MED ORDER — FLUTICASONE PROPIONATE 50 MCG/ACT NA SUSP
1.0000 | Freq: Every day | NASAL | Status: DC
  Administered 2020-07-15 – 2020-07-18 (×3): 1 via NASAL
  Filled 2020-07-15: qty 16

## 2020-07-15 MED ORDER — IPRATROPIUM-ALBUTEROL 0.5-2.5 (3) MG/3ML IN SOLN
3.0000 mL | Freq: Once | RESPIRATORY_TRACT | Status: AC
  Administered 2020-07-15: 3 mL via RESPIRATORY_TRACT
  Filled 2020-07-15: qty 3

## 2020-07-15 MED ORDER — IPRATROPIUM-ALBUTEROL 0.5-2.5 (3) MG/3ML IN SOLN
3.0000 mL | Freq: Four times a day (QID) | RESPIRATORY_TRACT | Status: DC
  Administered 2020-07-15 – 2020-07-16 (×5): 3 mL via RESPIRATORY_TRACT
  Filled 2020-07-15 (×4): qty 3

## 2020-07-15 MED ORDER — HEPARIN SODIUM (PORCINE) 5000 UNIT/ML IJ SOLN
5000.0000 [IU] | Freq: Three times a day (TID) | INTRAMUSCULAR | Status: DC
  Administered 2020-07-15 – 2020-07-17 (×8): 5000 [IU] via SUBCUTANEOUS
  Filled 2020-07-15 (×8): qty 1

## 2020-07-15 MED ORDER — IOHEXOL 350 MG/ML SOLN
100.0000 mL | Freq: Once | INTRAVENOUS | Status: AC | PRN
  Administered 2020-07-15: 100 mL via INTRAVENOUS

## 2020-07-15 MED ORDER — METHYLPREDNISOLONE SODIUM SUCC 40 MG IJ SOLR
40.0000 mg | Freq: Two times a day (BID) | INTRAMUSCULAR | Status: DC
  Administered 2020-07-15 – 2020-07-16 (×2): 40 mg via INTRAVENOUS
  Filled 2020-07-15 (×2): qty 1

## 2020-07-15 NOTE — ED Notes (Signed)
Pulse went up to 170 while ambulating, pt states his body is shaking afterwards.

## 2020-07-15 NOTE — ED Provider Notes (Signed)
[facility]-EMERGENCY DEPT Provider Note   CSN: [id number] Arrival date & time: 07/15/20  0944    History Chief Complaint  Patient presents with  . Shortness of Breath    Alexander Black is a 55 y.o. male with past medical history significant for remote left lower extremity surgery who presents for evaluation of cough and shortness of breath.  This is his third visit for similar complaints.  Has been on multiple antibiotics without relief. States his symptoms have worsened since they started 10 days ago.  Patient stated this morning he felt he could not take a deep breath. On EMS arrival patient was not moving much air.  Subsequently given albuterol, Atrovent and steroids.  Patient denies any PND or orthopnea.  No history of asthma, COPD, CHF.  Denies any unilateral leg swelling, redness or warmth.  Does feel like he has some tightness across his chest.  Does not radiating to left arm, left back and jaw.  He has had chills however has not taken his temperature.  He is vaccinated and boosted for COVID.  States he has had 2 outpatient COVID test which were negative.  No headache, lightheadedness, dizziness, abdominal pain, diarrhea, dysuria, paresthesias or weakness.  He does feel like he has sommuscous in his throat like he cannot cough this up.  He states he is able to cough it is clear mucus.  Able to tolerate p.o. intake at home without difficulty.  Mild associated rhinorrhea.  Denies additional aggravating or alleviating factors.  History obtained from patient and past medical records.  No interpreter used.  Chart review>>  07/04/20>> UC visit (ST, cough) started on Augmentin  07/05/20>> ED visit (Cough) Chest xray clear >>continue home Abx  07/13/20>> UC visit (Cough, SOB) Chest xray with right LL PNA>> Dc home with Doxy, Ceftin  07/15/20>> Virtual visit with PCP>> Told told come to ED for worsening SX, Ems called   HPI     History reviewed. No pertinent past medical  history.  There are no problems to display for this patient.   Past Surgical History:  Procedure Laterality Date  . LEG SURGERY         History reviewed. No pertinent family history.  Social History   Tobacco Use  . Smoking status: Never Smoker  . Smokeless tobacco: Never Used  Substance Use Topics  . Alcohol use: Not Currently  . Drug use: Not Currently    Home Medications Prior to Admission medications   Medication Sig Start Date End Date Taking? Authorizing Provider  benzonatate (TESSALON) 100 MG capsule Take 1 capsule (100 mg total) by mouth 3 (three) times daily as needed for cough. 07/05/20   Robinson, [place] N, PA-C  cefUROXime (CEFTIN) 500 MG tablet Take 1 tablet (500 mg total) by mouth 2 (two) times daily with a meal. 07/13/20   Raspet, Noberto Retort, PA-C  diclofenac (VOLTAREN) 75 MG EC tablet Take 1 tablet (75 mg total) by mouth 2 (two) times daily. 05/21/19   Elson Areas, PA-C  doxycycline (VIBRAMYCIN) 100 MG capsule Take 1 capsule (100 mg total) by mouth 2 (two) times daily. 07/13/20   Raspet, Noberto Retort, PA-C  methocarbamol (ROBAXIN) 500 MG tablet Take 1 tablet (500 mg total) by mouth 4 (four) times daily. 05/21/19   Elson Areas, PA-C  promethazine-dextromethorphan (PROMETHAZINE-DM) 6.25-15 MG/5ML syrup Take 5 mLs by mouth 3 (three) times daily as needed for cough. 07/13/20   Raspet, Noberto Retort, PA-C  pseudoephedrine-guaifenesin (MUCINEX D) 60-600  MG 12 hr tablet Take 1 tablet by mouth every 12 (twelve) hours.    [provider]    Allergies    Patient has no known allergies.  Review of Systems   Review of Systems  Constitutional: Negative.   HENT: Positive for congestion and rhinorrhea. Negative for trouble swallowing and voice change.   Respiratory: Positive for cough, chest tightness and shortness of breath. Negative for apnea, choking, wheezing and stridor.   Cardiovascular: Negative.   Gastrointestinal: Negative.   Genitourinary: Negative.   Musculoskeletal:  Negative.   Skin: Negative.   Neurological: Positive for weakness (Generalized). Negative for dizziness, tremors, seizures, syncope and headaches.  All other systems reviewed and are negative.   Physical Exam Updated Vital Signs BP (!) 145/81 (BP Location: Right Arm)   Pulse (!) 112   Temp 97.9 F (36.6 C) (Oral)   Resp 19   SpO2 (!) 89%   Physical Exam Vitals and nursing note reviewed.  Constitutional:      General: He is not in acute distress.    Appearance: He is well-developed. He is obese. He is ill-appearing. He is not toxic-appearing or diaphoretic.  HENT:     Head: Normocephalic and atraumatic.     Mouth/Throat:     Pharynx: Oropharynx is clear.  Eyes:     Pupils: Pupils are equal, round, and reactive to light.  Cardiovascular:     Rate and Rhythm: Regular rhythm. Tachycardia present.     Pulses: Normal pulses.     Heart sounds: Normal heart sounds.     Comments: Heart rate 120s in room Pulmonary:     Effort: Pulmonary effort is normal. Tachypnea present. No respiratory distress.     Breath sounds: Decreased breath sounds present.     Comments: Decreased breath sounds bilaterally.  Tachypnea, breathing approximately 30 times a minute.  Oxygen mid 90s Abdominal:     General: Bowel sounds are normal. There is no distension.     Palpations: Abdomen is soft.     Comments: Soft, nontender  Musculoskeletal:        General: Normal range of motion.     Cervical back: Normal range of motion and neck supple.     Right lower leg: No tenderness. No edema.     Left lower leg: No tenderness. No edema.     Comments: Brace to left lower extremity.  Right lower extremity greater than left however patient states chronic due to prior left lower extremity.  Bony tenderness.  Compartments are  Skin:    General: Skin is warm and dry.     Capillary Refill: Capillary refill takes less than 2 seconds.     Comments: No rashes or lesions  Neurological:     General: No focal deficit  present.     Mental Status: He is alert and oriented to person, place, and time.     ED Results / Procedures / Treatments   Labs (all labs ordered are listed, but only abnormal results are displayed) Labs Reviewed  CBC WITH DIFFERENTIAL/PLATELET - Abnormal; Notable for the following components:      Result Value   MCV 78.7 (*)    Lymphs Abs 4.2 (*)    All other components within normal limits  BASIC METABOLIC PANEL - Abnormal; Notable for the following components:   Sodium 132 (*)    Potassium 3.3 (*)    Glucose, Bld 117 (*)    Calcium 8.7 (*)    All other components  within normal limits  LACTIC ACID, PLASMA - Abnormal; Notable for the following components:   Lactic Acid, Venous 3.0 (*)    All other components within normal limits  D-DIMER, QUANTITATIVE - Abnormal; Notable for the following components:   D-Dimer, Quant 0.51 (*)    All other components within normal limits  RESP PANEL BY RT-PCR (FLU A&B, COVID) [id number]  CULTURE, BLOOD (ROUTINE X 2)  CULTURE, BLOOD (ROUTINE X 2)  LACTIC ACID, PLASMA  BRAIN NATRIURETIC PEPTIDE  TROPONIN I (HIGH SENSITIVITY)  TROPONIN I (HIGH SENSITIVITY)    EKG None  Radiology CT Angio Chest PE W/Cm &/Or Wo Cm  Result Date: 07/15/2020 CLINICAL DATA:  Shortness of breath EXAM: CT ANGIOGRAPHY CHEST WITH CONTRAST TECHNIQUE: Multidetector CT imaging of the chest was performed using the standard protocol during bolus administration of intravenous contrast. Multiplanar CT image reconstructions and MIPs were obtained to evaluate the vascular anatomy. CONTRAST:  100mL OMNIPAQUE IOHEXOL 350 MG/ML SOLN COMPARISON:  None. FINDINGS: Cardiovascular: No filling defects in the pulmonary arteries to suggest pulmonary emboli. Heart is normal size. Aorta is normal caliber. Mediastinum/Nodes: No mediastinal, hilar, or axillary adenopathy. Trachea and esophagus are unremarkable. Thyroid unremarkable. Lungs/Pleura: Lungs are clear. No focal airspace opacities or  suspicious nodules. No effusions. Upper Abdomen: Imaging into the upper abdomen demonstrates no acute findings. Musculoskeletal: Chest wall soft tissues are unremarkable. No acute bony abnormality. Review of the MIP images confirms the above findings. IMPRESSION: No evidence of pulmonary embolus. No acute cardiopulmonary disease. Electronically Signed   By: Charlett NoseKevin  Dover M.D.   On: 07/15/2020 12:13   DG Chest Portable 1 View  Result Date: 07/15/2020 CLINICAL DATA:  Shortness of breath, cough. EXAM: PORTABLE CHEST 1 VIEW COMPARISON:  Chest radiograph dated 07/13/2020. FINDINGS: The heart size and mediastinal contours are within normal limits. There is suggestion of mild bibasilar atelectasis/airspace disease. No pleural effusion or pneumothorax. The visualized skeletal structures are unremarkable. IMPRESSION: Suggestion of mild bibasilar atelectasis/airspace disease. Electronically Signed   By: Romona Curlsyler  Litton M.D.   On: 07/15/2020 11:23   Procedures Procedures   Medications Ordered in ED Medications  sodium chloride 0.9 % bolus 1,000 mL (has no administration in time range)  sodium chloride 0.9 % bolus 1,000 mL (1,000 mLs Intravenous New Bag/Given 07/15/20 1027)  iohexol (OMNIPAQUE) 350 MG/ML injection 100 mL (100 mLs Intravenous Contrast Given 07/15/20 1145)  ipratropium-albuterol (DUONEB) 0.5-2.5 (3) MG/3ML nebulizer solution 3 mL (3 mLs Nebulization Given 07/15/20 1241)    ED Course  I have reviewed the triage vital signs and the nursing notes.  Pertinent labs & imaging results that were available during my care of the patient were reviewed by me and considered in my medical decision making (see chart for details).  55 year old here for evaluation of cough and shortness of breath.  He is afebrile, nonseptic appearing however does appear ill.  This is third visit for similar complaints.  He has been on 2 rounds of outpatient antibiotics with worsening of symptoms.  He has had 2 negative COVID test  and is vaccinated and boosted as well.  His heart is clear however he is tachycardic into the 120s.  He has decreased breath sounds bilaterally and is tachypneic into the 30s.  Subsequently placed on 2 L of oxygen for comfort.  When seen by EMS he was given DuoNebs, albuterol as well as 125 Solu-Medrol.  He does not appear clinically fluid overloaded.  His abdomen is soft, nontender.  His compartments are soft.  His  right lower extremity is greater than left her patient states this is chronic due to prior left lower extremity surgery.  No prior history of PE or DVTs.  No rashes or lesions.  Plan on labs, imaging and reassess  Labs and imaging personally reviewed and interpreted:  CBC without leukocytosis  BMP sodium 132, potassium 3.3, glucose 117, no additional electrolyte, renal or liver abnormality lactic acid 1.7>>3.0 D-dimer 0.51 CTA chest without acute abnormality Trop 2>>2 BNP 17.6 COVID, flu negative DG chest without acute abnormality EKG sinus tacycardia   Nursing went to ambulate patient.  He became severely tachycardic into the 170s, tachypneic into the 40s, lightheaded, shaky. Dropped oxygen to 89% on RA. Placed back on 2 L [place].  Unclear etiology of patient's symptoms, question viral illness, will admit for further management of acute hypoxic respiratory failure  CONSULT with Dr. Gwenlyn Perking with [facility] who will evaluate patient for admission.  The patient appears reasonably stabilized for admission considering the current resources, flow, and capabilities available in the ED at this time, and I doubt any other [facility] requiring further screening and/or treatment in the ED prior to admission.  Discussed with attending, Dr. Estell Harpin agrees above treatment, plan and disposition     MDM Rules/Calculators/A&P                          Alexander Black was evaluated in Emergency Department on 07/15/2020 for the symptoms described in the history of present illness. He was evaluated in the context of  the global COVID-19 pandemic, which necessitated consideration that the patient might be at risk for infection with the SARS-CoV-2 virus that causes COVID-19. Institutional protocols and algorithms that pertain to the evaluation of patients at risk for COVID-19 are in a state of rapid change based on information released by regulatory bodies including the CDC and federal and state organizations. These policies and algorithms were followed during the patient's care in the ED. Final Clinical Impression(s) / ED Diagnoses Final diagnoses:  Acute respiratory failure with hypoxia (HCC)  Cough  Tachycardia    Rx / DC Orders ED Discharge Orders    None       Shota Kohrs A, PA-C 07/15/20 1535    Bethann Berkshire, MD 07/17/20 [phone]

## 2020-07-15 NOTE — ED Notes (Signed)
Lactic 3.0  Reported to B.Henderly

## 2020-07-15 NOTE — ED Notes (Signed)
After using urinal, pt spo2 down to 89%. Placed back on o2

## 2020-07-15 NOTE — ED Triage Notes (Addendum)
Pt arrived via EMS from home, SOB, cough, congestion. Has been tested for COVID, negative result. Has been treated with abx, finished course, no improvement.  Given en route 18 G L hand 10 mg albuterol  0.5 atrovent  125mg  solumedrol

## 2020-07-16 DIAGNOSIS — J9601 Acute respiratory failure with hypoxia: Secondary | ICD-10-CM | POA: Diagnosis not present

## 2020-07-16 LAB — CBC
HCT: 36.3 % — ABNORMAL LOW (ref 39.0–52.0)
Hemoglobin: 13 g/dL (ref 13.0–17.0)
MCH: 27.3 pg (ref 26.0–34.0)
MCHC: 35.8 g/dL (ref 30.0–36.0)
MCV: 76.3 fL — ABNORMAL LOW (ref 80.0–100.0)
Platelets: 266 10*3/uL (ref 150–400)
RBC: 4.76 MIL/uL (ref 4.22–5.81)
RDW: 14.9 % (ref 11.5–15.5)
WBC: 7.3 10*3/uL (ref 4.0–10.5)
nRBC: 0 % (ref 0.0–0.2)

## 2020-07-16 LAB — COMPREHENSIVE METABOLIC PANEL
ALT: 23 U/L (ref 0–44)
AST: 18 U/L (ref 15–41)
Albumin: 3.5 g/dL (ref 3.5–5.0)
Alkaline Phosphatase: 64 U/L (ref 38–126)
Anion gap: 6 (ref 5–15)
BUN: 11 mg/dL (ref 6–20)
CO2: 25 mmol/L (ref 22–32)
Calcium: 8.7 mg/dL — ABNORMAL LOW (ref 8.9–10.3)
Chloride: 106 mmol/L (ref 98–111)
Creatinine, Ser: 0.72 mg/dL (ref 0.61–1.24)
GFR, Estimated: >60 mL/min (ref >60)
Glucose, Bld: 144 mg/dL — ABNORMAL HIGH (ref 70–99)
Potassium: 4.2 mmol/L (ref 3.5–5.1)
Sodium: 137 mmol/L (ref 135–145)
Total Bilirubin: 0.5 mg/dL (ref 0.3–1.2)
Total Protein: 6.6 g/dL (ref 6.5–8.1)

## 2020-07-16 LAB — HIV ANTIBODY (ROUTINE TESTING W REFLEX): HIV Screen 4th Generation wRfx: NONREACTIVE

## 2020-07-16 MED ORDER — ONDANSETRON HCL 4 MG PO TABS: 4.0000 mg | ORAL_TABLET | Freq: Four times a day (QID) | ORAL | Status: DC | PRN

## 2020-07-16 MED ORDER — LORATADINE 10 MG PO TABS
10.0000 mg | ORAL_TABLET | Freq: Every day | ORAL | Status: DC
  Administered 2020-07-16 – 2020-07-20 (×5): 10 mg via ORAL
  Filled 2020-07-16 (×5): qty 1

## 2020-07-16 MED ORDER — ACETAMINOPHEN 650 MG RE SUPP: 650.0000 mg | Freq: Four times a day (QID) | RECTAL | Status: DC | PRN

## 2020-07-16 MED ORDER — GUAIFENESIN-DM 100-10 MG/5ML PO SYRP
5.0000 mL | ORAL_SOLUTION | ORAL | Status: DC | PRN
  Administered 2020-07-17 – 2020-07-20 (×6): 5 mL via ORAL
  Filled 2020-07-16 (×6): qty 10

## 2020-07-16 MED ORDER — PANTOPRAZOLE SODIUM 40 MG PO TBEC
40.0000 mg | DELAYED_RELEASE_TABLET | Freq: Every day | ORAL | Status: DC
  Administered 2020-07-16 – 2020-07-20 (×5): 40 mg via ORAL
  Filled 2020-07-16 (×5): qty 1

## 2020-07-16 MED ORDER — POLYETHYLENE GLYCOL 3350 17 G PO PACK
17.0000 g | PACK | Freq: Every day | ORAL | Status: DC
  Administered 2020-07-16 – 2020-07-18 (×2): 17 g via ORAL
  Filled 2020-07-16 (×4): qty 1

## 2020-07-16 MED ORDER — POTASSIUM CHLORIDE CRYS ER 20 MEQ PO TBCR
40.0000 meq | EXTENDED_RELEASE_TABLET | Freq: Once | ORAL | Status: AC
  Administered 2020-07-16: 40 meq via ORAL
  Filled 2020-07-16: qty 2

## 2020-07-16 MED ORDER — PREDNISONE 20 MG PO TABS
40.0000 mg | ORAL_TABLET | Freq: Every day | ORAL | Status: DC
  Administered 2020-07-17 – 2020-07-18 (×2): 40 mg via ORAL
  Filled 2020-07-16 (×2): qty 2

## 2020-07-16 MED ORDER — ACETAMINOPHEN 325 MG PO TABS
650.0000 mg | ORAL_TABLET | Freq: Four times a day (QID) | ORAL | Status: DC | PRN
  Administered 2020-07-18: 650 mg via ORAL
  Filled 2020-07-16: qty 2

## 2020-07-16 MED ORDER — ONDANSETRON HCL 4 MG/2ML IJ SOLN: 4.0000 mg | Freq: Four times a day (QID) | INTRAMUSCULAR | Status: DC | PRN

## 2020-07-16 MED ORDER — IPRATROPIUM-ALBUTEROL 0.5-2.5 (3) MG/3ML IN SOLN
3.0000 mL | Freq: Three times a day (TID) | RESPIRATORY_TRACT | Status: DC
  Administered 2020-07-16 – 2020-07-18 (×5): 3 mL via RESPIRATORY_TRACT
  Filled 2020-07-16 (×5): qty 3

## 2020-07-16 NOTE — Progress Notes (Signed)
PROGRESS NOTE    Yochanan Eddleman  VFI:[id number] DOB: 1965-11-23 DOA: 07/15/2020 PCP: Patient, No Pcp Per (Inactive)    Brief Narrative:  Mr. Barletta was admitted to the hospital with acute hypoxic respiratory failure due to reactive airway disease.  55 year old male past medical history for obesity, osteoarthritis who presented with dyspnea.  Reported 10 days of dyspnea, associated with productive cough.  Patient was treated as an outpatient with antibiotics with no resolution of his symptoms.  On his initial physical examination blood pressure 135/70, heart rate 109, respirate 19, oxygen saturation 95%, temperature 98, lungs with rhonchi but no wheezing, heart S1-S2, present, rhythmic, soft abdomen, no lower extremity edema.  Sodium 132, potassium 3.3, chloride 1 1, bicarb 22, glucose 117, BUN 12, creatinine 0.72, white count 8.0, hemoglobin 14.6, hematocrit 42.4, platelets 282. SARS COVID-19 negative.  Chest radiograph with bibasilar faint atelectasis CT chest negative for pulmonary embolism.  No infiltrates.  EKG 122 bpm, left axis deviation, normal intervals, sinus rhythm, no ST segment or T wave changes.  Assessment & Plan:   Active Problems:   Acute respiratory failure with hypoxia (HCC)   1.  Hyperactive airway patient continue to have cough but improving dyspnea, no wheezing, no chest pain. No dysphagia.  Continue with bronchodilator therapy and antihistamines. Add pantoprazole  Short course of steroids.   Patient will need outpatient PFT.   Possible dc home in am,  2.  Hypokalemia stable renal function with serum cr at 0.72, K is 4,2 and bicarbonate is 25. Follow up renal function as outpatient   3.  Obesity. Will need outpatient follow up      Patient continue to be at high risk for   Status is: Inpatient  Remains inpatient appropriate because:Inpatient level of care appropriate due to severity of illness   Dispo: The patient is from: Home               Anticipated d/c is to: Home              Patient currently is not medically stable to d/c.   Difficult to place patient No    DVT prophylaxis: Enoxaparin   Code Status:   full  Family Communication:  No family at the bedside      Subjective: Patient is feeling better, continue to have cough, no nausea or vomiting, no wheezing, dyspnea is improving   Objective: Vitals:   07/15/20 2321 07/16/20 0121 07/16/20 0408 07/16/20 0729  BP: 116/67  103/72   Pulse: 91  85   Resp: 18  18   Temp: 98 F (36.7 C)  (!) 97.3 F (36.3 C)   TempSrc: Oral  Oral   SpO2: 93% 94% 91% 93%    Intake/Output Summary (Last 24 hours) at 07/16/2020 1327 Last data filed at 07/15/2020 1357 Gross per 24 hour  Intake 1000 ml  Output --  Net 1000 ml   There were no vitals filed for this visit.  Examination:   General: Not in pain or dyspnea,.  Neurology: Awake and alert, non focal  E ENT: no pallor, no icterus, oral mucosa moist Cardiovascular: No JVD. S1-S2 present, rhythmic, no gallops, rubs, or murmurs. No lower extremity edema. Pulmonary:  Positive breath sounds bilaterally, with no wheezing, rhonchi or rales. Gastrointestinal. Abdomen soft and non tender Skin. No rashes Musculoskeletal: no joint deformities     Data Reviewed: I have personally reviewed following labs and imaging studies  CBC: Recent Labs  Lab 07/13/20 1141 07/15/20 1008  07/16/20 0537  WBC 4.9 8.0 7.3  NEUTROABS 2.6 2.8  --   HGB 15.0 14.6 13.0  HCT 42.3 42.4 36.3*  MCV 77.3* 78.7* 76.3*  PLT 303 282 266   Basic Metabolic Panel: Recent Labs  Lab 07/13/20 1141 07/15/20 1008 07/15/20 1522 07/16/20 0537  NA 136 132*  --  137  K 4.3 3.3*  --  4.2  CL 106 101  --  106  CO2 25 22  --  25  GLUCOSE 107* 117*  --  144*  BUN 8 12  --  11  CREATININE 0.79 0.72  --  0.72  CALCIUM 9.2 8.7*  --  8.7*  MG  --   --  2.0  --    GFR: CrCl cannot be calculated (Unknown ideal weight.). Liver Function Tests: Recent  Labs  Lab 07/13/20 1141 07/16/20 0537  AST 22 18  ALT 24 23  ALKPHOS 69 64  BILITOT 0.7 0.5  PROT 7.4 6.6  ALBUMIN 3.8 3.5   No results for input(s): LIPASE, AMYLASE in the last 168 hours. No results for input(s): AMMONIA in the last 168 hours. Coagulation Profile: No results for input(s): INR, PROTIME in the last 168 hours. Cardiac Enzymes: No results for input(s): CKTOTAL, CKMB, CKMBINDEX, TROPONINI in the last 168 hours. BNP (last 3 results) No results for input(s): PROBNP in the last 8760 hours. HbA1C: No results for input(s): HGBA1C in the last 72 hours. CBG: No results for input(s): GLUCAP in the last 168 hours. Lipid Profile: No results for input(s): CHOL, HDL, LDLCALC, TRIG, CHOLHDL, LDLDIRECT in the last 72 hours. Thyroid Function Tests: No results for input(s): TSH, T4TOTAL, FREET4, T3FREE, THYROIDAB in the last 72 hours. Anemia Panel: No results for input(s): VITAMINB12, FOLATE, FERRITIN, TIBC, IRON, RETICCTPCT in the last 72 hours.    Radiology Studies: I have reviewed all of the imaging during this hospital visit personally     Scheduled Meds: . dextromethorphan  30 mg Oral BID  . fluticasone  1 spray Each Nare Daily  . heparin injection (subcutaneous)  5,000 Units Subcutaneous Q8H  . ipratropium-albuterol  3 mL Nebulization Q6H  . methylPREDNISolone (SOLU-MEDROL) injection  40 mg Intravenous Q12H  . pantoprazole  40 mg Oral Q1200   Continuous Infusions:   LOS: 1 day        Iniya Matzek Annett Gula, MD

## 2020-07-16 NOTE — H&P (Signed)
History and Physical    Alexander Black [id number]:[id number] DOB: [birth year] DOA: 07/15/2020  PCP: Patient, No Pcp Per (Inactive)   Patient coming from: home  I have personally briefly reviewed patient's old medical records in [facility]  Chief Complaint: SOB, coughing spells, congestion (nasal/throat)   HPI: Alexander Black is a 55 y.o. male with medical history significant of obesity, OA (s/p knee replacement) and obesity; who presented to ED with complaints of SOB, intermittent clear sputum productive cough and increased nasal congestion. No fever, no hemoptysis. Symptoms has been present for over 10 days now. He was seen by PCP amd treated with antibotics for CAP. Symptoms failed to completely resolved and a second round of antibiotics were prescribed at urgent care;48 hours ater that he noticed to be even more SOB and experiencing chocking spells. No CP, no nausea, no vomiting, no fever, no recent sick contacts.  ED Course: CXR showing some atelectasis; elevated d-dimer and subsequent CTA chest neg for PE. Steroidsand nebulizer management provided by EMS. [facility] contacted to assist with further evaluation and management   Review of Systems: As per HPI otherwise all other systems reviewed and are negative.   Past Surgical History:  Procedure Laterality Date  . LEG SURGERY      Social History  reports that he has never smoked. He has never used smokeless tobacco. He reports previous alcohol use. He reports previous drug use.  No Known Allergies  Family History -positive for HTN; otherwise non contributory.   Prior to Admission medications   Medication Sig Start Date End Date Taking? Authorizing Provider  benzonatate (TESSALON) 100 MG capsule Take 1 capsule (100 mg total) by mouth 3 (three) times daily as needed for cough. 07/05/20  Yes Robinson, [place] N, PA-C  cefUROXime (CEFTIN) 500 MG tablet Take 1 tablet (500 mg total) by mouth 2 (two) times daily with a meal. 07/13/20  Yes Raspet,  Erin K, PA-C  Cholecalciferol (VITAMIN D) 50 MCG (2000 UT) tablet Take 4,000 Units by mouth daily.   Yes [provider]  doxycycline (VIBRAMYCIN) 100 MG capsule Take 1 capsule (100 mg total) by mouth 2 (two) times daily. 07/13/20  Yes Raspet, Erin K, PA-C  fluticasone (FLONASE) 50 MCG/ACT nasal spray Place 1 spray into both nostrils daily as needed for allergies. 07/04/20  Yes [provider]  Multiple Vitamins-Minerals (ZINC PO) Take 1 tablet by mouth daily.   Yes [provider]  promethazine-dextromethorphan (PROMETHAZINE-DM) 6.25-15 MG/5ML syrup Take 5 mLs by mouth 3 (three) times daily as needed for cough. 07/13/20  Yes Raspet, Noberto Retort, PA-C  pseudoephedrine-guaifenesin (MUCINEX D) 60-600 MG 12 hr tablet Take 1 tablet by mouth every 12 (twelve) hours.   Yes [provider]  vitamin C (ASCORBIC ACID) 500 MG tablet Take 1,000 mg by mouth daily.   Yes [provider]  diclofenac (VOLTAREN) 75 MG EC tablet Take 1 tablet (75 mg total) by mouth 2 (two) times daily. Patient not taking: No sig reported 05/21/19   Elson Areas, PA-C  methocarbamol (ROBAXIN) 500 MG tablet Take 1 tablet (500 mg total) by mouth 4 (four) times daily. Patient not taking: Reported on 07/15/2020 05/21/19   Osie Cheeks    Physical Exam: Vitals:   07/15/20 1518 07/15/20 1723 07/15/20 1945 07/15/20 2321  BP: 135/70  131/81 116/67  Pulse: (!) 109  (!) 108 91  Resp: 19  20 18   Temp: 98 F (36.7 C)  98.4 F (36.9 C) 98 F (  36.7 C)  TempSrc: Oral  Oral Oral  SpO2: 95% 97% 96% 93%    Constitutional: NAD, calm, comfortable Vitals:   07/15/20 1518 07/15/20 1723 07/15/20 1945 07/15/20 2321  BP: 135/70  131/81 116/67  Pulse: (!) 109  (!) 108 91  Resp: 19  20 18   Temp: 98 F (36.7 C)  98.4 F (36.9 C) 98 F (36.7 C)  TempSrc: Oral  Oral Oral  SpO2: 95% 97% 96% 93%   Eyes: PERRL, lids and conjunctivae normal, no nystagmus  ENMT: Mucous membranes are moist. Posterior  pharynx clear of any exudate or lesions. Neck: normal, supple, no masses, no thyromegaly, unable to assess JVD with body habitus. Respiratory: positive rhonchi, no wheezing, no crackles, fair air movement bilaterally. Cardiovascular: sinus tachycardia and regular rhythm, no murmurs / rubs / gallops. No extremity edema. 2+ pedal pulses. No carotid bruits.  Abdomen: obese, no tenderness, no masses palpated. No hepatosplenomegaly. Bowel sounds positive.  Musculoskeletal: no clubbing / cyanosis. No joint deformity upper and lower extremities. Normal muscle tone.  Skin: no rashes, no petechiae. Neurologic: CN 2-12 grossly intact. Sensation intact, DTR normal. Strength 5/5 in all 4.  Psychiatric: Normal judgment and insight. Alert and oriented x 3. Normal mood.    Labs on Admission: I have personally reviewed following labs and imaging studies  CBC: Recent Labs  Lab 07/13/20 1141 07/15/20 1008  WBC 4.9 8.0  NEUTROABS 2.6 2.8  HGB 15.0 14.6  HCT 42.3 42.4  MCV 77.3* 78.7*  PLT 303 282    Basic Metabolic Panel: Recent Labs  Lab 07/13/20 1141 07/15/20 1008 07/15/20 1522  NA 136 132*  --   K 4.3 3.3*  --   CL 106 101  --   CO2 25 22  --   GLUCOSE 107* 117*  --   BUN 8 12  --   CREATININE 0.79 0.72  --   CALCIUM 9.2 8.7*  --   MG  --   --  2.0    GFR: CrCl cannot be calculated (Unknown ideal weight.).  Liver Function Tests: Recent Labs  Lab 07/13/20 1141  AST 22  ALT 24  ALKPHOS 69  BILITOT 0.7  PROT 7.4  ALBUMIN 3.8    Radiological Exams on Admission: CT Angio Chest PE W/Cm &/Or Wo Cm  Result Date: 07/15/2020 CLINICAL DATA:  Shortness of breath EXAM: CT ANGIOGRAPHY CHEST WITH CONTRAST TECHNIQUE: Multidetector CT imaging of the chest was performed using the standard protocol during bolus administration of intravenous contrast. Multiplanar CT image reconstructions and MIPs were obtained to evaluate the vascular anatomy. CONTRAST:  09/14/2020 OMNIPAQUE IOHEXOL 350 MG/ML  SOLN COMPARISON:  None. FINDINGS: Cardiovascular: No filling defects in the pulmonary arteries to suggest pulmonary emboli. Heart is normal size. Aorta is normal caliber. Mediastinum/Nodes: No mediastinal, hilar, or axillary adenopathy. Trachea and esophagus are unremarkable. Thyroid unremarkable. Lungs/Pleura: Lungs are clear. No focal airspace opacities or suspicious nodules. No effusions. Upper Abdomen: Imaging into the upper abdomen demonstrates no acute findings. Musculoskeletal: Chest wall soft tissues are unremarkable. No acute bony abnormality. Review of the MIP images confirms the above findings. IMPRESSION: No evidence of pulmonary embolus. No acute cardiopulmonary disease. Electronically Signed   By: M.D.   On: 07/15/2020 12:13   DG Chest Portable 1 View  Result Date: 07/15/2020 CLINICAL DATA:  Shortness of breath, cough. EXAM: PORTABLE CHEST 1 VIEW COMPARISON:  Chest radiograph dated 07/13/2020. FINDINGS: The heart size and mediastinal contours are within normal limits. There  is suggestion of mild bibasilar atelectasis/airspace disease. No pleural effusion or pneumothorax. The visualized skeletal structures are unremarkable. IMPRESSION: Suggestion of mild bibasilar atelectasis/airspace disease. Electronically Signed   By: Romona Curls M.D.   On: 07/15/2020 11:23    EKG: Independently reviewed. No acute ischemic changes.  Assessment/Plan 1-Acute respiratory failure with hypoxia (HCC) -in the setting of reactive airway disease -wean off oxygen supplementation as tolerated -provide PRN nebulizer management -steroids, flonase, delsym and the use of flutter valve/incentive spirometry   2-hypokalemia -will replete and follow electrolyte trend -checking Mg level  3-obesity -low calorie diet, portion control and increase physical activity discussed with patient.  4-elevated d-dimer -neg CTA for PE -continue DVT prophylaxis.    DVT prophylaxis: Heparin  Code Status:    Full Code. Family Communication:  No family at bedside. Disposition Plan:   Patient is from:  home  Anticipated DC to:  home  Anticipated DC date:  5/12--5/13  Anticipated DC barriers: Stabilization of resp status.  Consults called:  None  Admission status:  Inpatient , med-surg, LOS > 2 midnight.  Severity of Illness: Mild to moderate severity; mild acute resp failure with hypoxia. Patient with recent bacterial PNA/bronchitis and subsequent un-resolving symptoms. Found with O2 sat in the high 80's  With exertion. Positive obesity, but no prior hx of lung disease.    Vassie Loll MD [facility]  How to contact the [facility] Attending or Consulting provider 7A - 7P or covering provider during after hours 7P -7A, for this patient?   1. Check the care team in [facility] and look for a) attending/consulting [facility] provider listed and b) the [facility] team listed 2. Log into [email or web address] and use 's universal password to access. If you do not have the password, please contact the hospital operator. 3. Locate the [facility] provider you are looking for under [facility] and page to a number that you can be directly reached. 4. If you still have difficulty reaching the provider, please page the [facility] (Director on Call) for the Hospitalists listed on amion for assistance.  07/16/2020, 12:03 AM

## 2020-07-17 DIAGNOSIS — J9601 Acute respiratory failure with hypoxia: Secondary | ICD-10-CM | POA: Diagnosis not present

## 2020-07-17 LAB — LACTIC ACID, PLASMA
Lactic Acid, Venous: 2.6 mmol/L (ref 0.5–1.9)
Lactic Acid, Venous: 3.1 mmol/L (ref 0.5–1.9)

## 2020-07-17 MED ORDER — SODIUM CHLORIDE 0.9 % IV SOLN: INTRAVENOUS | Status: DC

## 2020-07-17 NOTE — Progress Notes (Signed)
PROGRESS NOTE    Alexander Black  UXL:[id number] DOB: June 08, 1965 DOA: 07/15/2020 PCP: Patient, No Pcp Per (Inactive)   Brief Narrative:  This 55 years old male with PMH significant for obesity, osteoarthritis who presented in the ED with shortness of breath.  Patient reports having shortness of breath associated with productive cough for last 10 days which has been getting worse,  he was treated as an outpatient with antibiotics with no resolution of his symptoms.  In the ED his chest xray shows bibasilar faint atelectasis,  CT chest negative for pulmonary embolism but no infiltrate.  Patient was admitted for acute hypoxic respiratory failure secondary to hyperactive airway disease.  Patient continued to have cough, started on bronchodilator therapy,  short courses of steroids and pantoprazole.    Assessment & Plan:   Active Problems:   Acute respiratory failure with hypoxia (HCC)  Acute hypoxic respiratory failure: In the setting of reactive airway disease.   Patient was hypoxic on arrival in the ED,  requiring oxygen. He was successfully weaned down to room air. Continue nebulization as needed Continue prednisone,  Flonase and  Delsym. Continue incentive spirometry and flutter valve. Patient will need outpatient PFTs.  Hypokalemia: Replaced and improved.  Obesity: Advised diet and exercise.  DVT prophylaxis: Lovenox Code Status: Full code Family Communication: No family at bedside Disposition Plan:   Status is: Inpatient  Remains inpatient appropriate because:Inpatient level of care appropriate due to severity of illness   Dispo: The patient is from: Home              Anticipated d/c is to: Home on 5/13              Patient currently is not medically stable to d/c.   Difficult to place patient No  Consultants:    None  Procedures: None Antimicrobials:   Anti-infectives (From admission, onward)   None      Subjective: Patient was seen and examined at bedside.   Overnight events noted.  Patient continues to report having cough with white phlegm but denies any fever, He reports difficulty breathing is improving.  Objective: Vitals:   07/16/20 2018 07/17/20 0517 07/17/20 0746 07/17/20 1521  BP:  (!) 143/94  122/84  Pulse:  87  (!) 102  Resp:  20  17  Temp:    97.9 F (36.6 C)  TempSrc:    Oral  SpO2: 96% 98% 96% 96%    Intake/Output Summary (Last 24 hours) at 07/17/2020 1617 Last data filed at 07/16/2020 1905 Gross per 24 hour  Intake 240 ml  Output --  Net 240 ml   There were no vitals filed for this visit.  Examination:  General exam: Appears calm and comfortable , not in any acute distress Respiratory system: Clear to auscultation. Respiratory effort normal. Cardiovascular system: S1 & S2 heard, RRR. No JVD, murmurs, rubs, gallops or clicks. No pedal edema. Gastrointestinal system: Abdomen is nondistended, soft and nontender. No organomegaly or masses felt. Normal bowel sounds heard. Central nervous system: Alert and oriented. No focal neurological deficits. Extremities: Symmetric 5 x 5 power.  No edema, no cyanosis, no clubbing. Skin: No rashes, lesions or ulcers Psychiatry: Judgement and insight appear normal. Mood & affect appropriate.     Data Reviewed: I have personally reviewed following labs and imaging studies  CBC: Recent Labs  Lab 07/13/20 1141 07/15/20 1008 07/16/20 0537  WBC 4.9 8.0 7.3  NEUTROABS 2.6 2.8  --   HGB 15.0 14.6 13.0  HCT 42.3 42.4 36.3*  MCV 77.3* 78.7* 76.3*  PLT 303 282 266   Basic Metabolic Panel: Recent Labs  Lab 07/13/20 1141 07/15/20 1008 07/15/20 1522 07/16/20 0537  NA 136 132*  --  137  K 4.3 3.3*  --  4.2  CL 106 101  --  106  CO2 25 22  --  25  GLUCOSE 107* 117*  --  144*  BUN 8 12  --  11  CREATININE 0.79 0.72  --  0.72  CALCIUM 9.2 8.7*  --  8.7*  MG  --   --  2.0  --    GFR: CrCl cannot be calculated (Unknown ideal weight.). Liver Function Tests: Recent Labs  Lab  07/13/20 1141 07/16/20 0537  AST 22 18  ALT 24 23  ALKPHOS 69 64  BILITOT 0.7 0.5  PROT 7.4 6.6  ALBUMIN 3.8 3.5   No results for input(s): LIPASE, AMYLASE in the last 168 hours. No results for input(s): AMMONIA in the last 168 hours. Coagulation Profile: No results for input(s): INR, PROTIME in the last 168 hours. Cardiac Enzymes: No results for input(s): CKTOTAL, CKMB, CKMBINDEX, TROPONINI in the last 168 hours. BNP (last 3 results) No results for input(s): PROBNP in the last 8760 hours. HbA1C: No results for input(s): HGBA1C in the last 72 hours. CBG: No results for input(s): GLUCAP in the last 168 hours. Lipid Profile: No results for input(s): CHOL, HDL, LDLCALC, TRIG, CHOLHDL, LDLDIRECT in the last 72 hours. Thyroid Function Tests: No results for input(s): TSH, T4TOTAL, FREET4, T3FREE, THYROIDAB in the last 72 hours. Anemia Panel: No results for input(s): VITAMINB12, FOLATE, FERRITIN, TIBC, IRON, RETICCTPCT in the last 72 hours. Sepsis Labs: Recent Labs  Lab 07/15/20 1008 07/15/20 1211 07/17/20 1046 07/17/20 1450  LATICACIDVEN 1.7 3.0* 2.6* 3.1*    Recent Results (from the past 240 hour(s))  Blood culture (routine x 2)     Status: None (Preliminary result)   Collection Time: 07/15/20 10:15 AM   Specimen: BLOOD RIGHT HAND  Result Value Ref Range Status   Specimen Description   Final    BLOOD RIGHT HAND Performed at [facility], [address], [place], [place] [address]    Special Requests   Final    BOTTLES DRAWN AEROBIC AND ANAEROBIC Blood Culture adequate volume Performed at [facility], [address], [place], [place] [address]    Culture   Final    NO GROWTH 2 DAYS Performed at [facility], [address], [place], [place] [address]    Report Status PENDING  Incomplete  Resp Panel by RT-PCR (Flu A&B, Covid) Nasopharyngeal Swab     Status: None   Collection Time: 07/15/20 10:15 AM   Specimen:  Nasopharyngeal Swab; Nasopharyngeal(NP) swabs in vial transport medium  Result Value Ref Range Status   SARS Coronavirus 2 by RT PCR NEGATIVE NEGATIVE Final    Comment: (NOTE) SARS-CoV-2 target nucleic acids are NOT DETECTED.  The SARS-CoV-2 RNA is generally detectable in upper respiratory specimens during the acute phase of infection. The lowest concentration of SARS-CoV-2 viral copies this assay can detect is 138 copies/mL. A negative result does not preclude SARS-Cov-2 infection and should not be used as the sole basis for treatment or other patient management decisions. A negative result may occur with  improper specimen collection/handling, submission of specimen other than nasopharyngeal swab, presence of viral mutation(s) within the areas targeted by this assay, and inadequate number of viral  copies(<138 copies/mL). A negative result must be combined with clinical observations, patient history, and epidemiological information. The expected result is Negative.  Fact Sheet for Patients:  BloggerCourse.com  Fact Sheet for Healthcare Providers:  SeriousBroker.it  This test is no t yet approved or cleared by the [place] FDA and  has been authorized for detection and/or diagnosis of SARS-CoV-2 by FDA under an Emergency Use Authorization (EUA). This EUA will remain  in effect (meaning this test can be used) for the duration of the COVID-19 declaration under Section 564(b)(1) of the Act, 21 U.S.C.section 360bbb-3(b)(1), unless the authorization is terminated  or revoked sooner.       Influenza A by PCR NEGATIVE NEGATIVE Final   Influenza B by PCR NEGATIVE NEGATIVE Final    Comment: (NOTE) The Xpert Xpress SARS-CoV-2/FLU/RSV plus assay is intended as an aid in the diagnosis of influenza from Nasopharyngeal swab specimens and should not be used as a sole basis for treatment. Nasal washings and aspirates are unacceptable for  Xpert Xpress SARS-CoV-2/FLU/RSV testing.  Fact Sheet for Patients: BloggerCourse.com  Fact Sheet for Healthcare Providers: SeriousBroker.it  This test is not yet approved or cleared by the [place] FDA and has been authorized for detection and/or diagnosis of SARS-CoV-2 by FDA under an Emergency Use Authorization (EUA). This EUA will remain in effect (meaning this test can be used) for the duration of the COVID-19 declaration under Section 564(b)(1) of the Act, 21 U.S.C. section 360bbb-3(b)(1), unless the authorization is terminated or revoked.  Performed at [facility], [address] [place], [place] [address]   Blood culture (routine x 2)     Status: None (Preliminary result)   Collection Time: 07/15/20 10:20 AM   Specimen: BLOOD  Result Value Ref Range Status   Specimen Description   Final    BLOOD RIGHT ANTECUBITAL Performed at [facility], [address], [place], [place] [address]    Special Requests   Final    BOTTLES DRAWN AEROBIC AND ANAEROBIC Blood Culture adequate volume Performed at [facility], [address], [place], [place] [address]    Culture   Final    NO GROWTH 2 DAYS Performed at [facility], [address] [place], [place] [address]    Report Status PENDING  Incomplete    Radiology Studies: No results found.   Scheduled Meds: . fluticasone  1 spray Each Nare Daily  . heparin injection (subcutaneous)  5,000 Units Subcutaneous Q8H  . ipratropium-albuterol  3 mL Nebulization TID  . loratadine  10 mg Oral Daily  . pantoprazole  40 mg Oral Q1200  . polyethylene glycol  17 g Oral Daily  . predniSONE  40 mg Oral Q breakfast   Continuous Infusions:   LOS: 2 days    Time spent: 35 mins    Clarabell Matsuoka, MD [facility]   If 7PM-7AM, please contact night-coverage

## 2020-07-18 ENCOUNTER — Inpatient Hospital Stay (HOSPITAL_COMMUNITY): Payer: Medicare HMO

## 2020-07-18 DIAGNOSIS — R1312 Dysphagia, oropharyngeal phase: Secondary | ICD-10-CM

## 2020-07-18 DIAGNOSIS — J9601 Acute respiratory failure with hypoxia: Secondary | ICD-10-CM | POA: Diagnosis not present

## 2020-07-18 LAB — LACTIC ACID, PLASMA: Lactic Acid, Venous: 1.2 mmol/L (ref 0.5–1.9)

## 2020-07-18 MED ORDER — MONTELUKAST SODIUM 10 MG PO TABS
10.0000 mg | ORAL_TABLET | Freq: Every day | ORAL | Status: DC
  Administered 2020-07-18 – 2020-07-19 (×2): 10 mg via ORAL
  Filled 2020-07-18 (×2): qty 1

## 2020-07-18 MED ORDER — FAMOTIDINE 20 MG PO TABS
40.0000 mg | ORAL_TABLET | Freq: Every day | ORAL | Status: DC
  Administered 2020-07-18 – 2020-07-19 (×2): 40 mg via ORAL
  Filled 2020-07-18 (×2): qty 2

## 2020-07-18 MED ORDER — BUDESONIDE 0.25 MG/2ML IN SUSP: 0.2500 mg | Freq: Two times a day (BID) | RESPIRATORY_TRACT | Status: DC

## 2020-07-18 MED ORDER — BUDESONIDE 0.25 MG/2ML IN SUSP
0.2500 mg | Freq: Two times a day (BID) | RESPIRATORY_TRACT | Status: DC
  Administered 2020-07-18: 0.25 mg via RESPIRATORY_TRACT
  Filled 2020-07-18 (×2): qty 2

## 2020-07-18 NOTE — Plan of Care (Signed)
Pt very anxious the whole shift; first he c/o iv was leaking so IV was changed from left forearm to left lateral wrist. Pt is right handed; then he reported that the IV was hurting him so I removed the IV. Pt without an IV at the moment. He then reports lots of secretions from the throat that he spit in a urinal.About 75 ml observed of clear liquid with foam like saliva. No thick yellow or green discharge observed. Set of vitals taken and respiratory called to double verify that the pt's lungs have remained cleared since last breathing tx. Respiratory reports that breathing tx would not help patient at this time. All VS stable, no fever, no high bp,  no  respiratory distress or low oxygen. Suction was set up at bedside to help with secretions collection directly from mouth but pt stated that it did nothing for him. He complains of "bubbles" feeling in his abdomen. Dr. Margo Aye aware and charge nurse aware. Pt reassured that MD will see him in the am. Pt accepted some tylenol for the discomfort he reports in his abdomen. He c/o nausea but refused Zofran for now. He also have blood from nose when he uses a tissue and was unable to sleep.

## 2020-07-18 NOTE — Progress Notes (Signed)
PROGRESS NOTE    Alexander Black  AOZ:[id number] DOB: 1965/08/28 DOA: 07/15/2020 PCP: Patient, No Pcp Per (Inactive)   Brief Narrative:  This 55 years old male with PMH significant for obesity, osteoarthritis who presented in the ED with shortness of breath.  Patient reports having shortness of breath associated with productive cough for last 10 days which has been getting worse,  he was treated as an outpatient with antibiotics with no resolution of his symptoms.  In the ED his chest xray shows bibasilar faint atelectasis,  CT chest negative for pulmonary embolism but no infiltrate.  Patient was admitted for acute hypoxic respiratory failure secondary to hyperactive airway disease.  Patient continued to have cough, started on bronchodilator therapy,  short courses of steroids and pantoprazole.    Assessment & Plan:   Active Problems:   Acute respiratory failure with hypoxia (HCC)  Acute hypoxic respiratory failure: >  Resolved. In the setting of reactive airway disease.   Patient was hypoxic on arrival in the ED,  requiring oxygen. He was successfully weaned down to room air. Continue nebulization as needed Continue  Flonase and  Delsym. Continue incentive spirometry and flutter valve. Patient will need outpatient PFTs.  Intermittent paroxysmal coughing spells: Patient reports intermittent coughing spells when sitting up in the morning associated with stridor. Pulmonology consulted, thinks it could be reflux induced recurrent laryngitis,  upper airway cough syndrome. Recommended nebulized Pulmicort, Singulair Pepcid. DC prednisone Advised esophagram if shows evidence of stricture,  patient may require EGD. Patient may need outpatient ENT follow-up.  Hypokalemia: Replaced and improved.  Obesity: Advised diet and exercise.  DVT prophylaxis: Lovenox Code Status: Full code Family Communication: No family at bedside Disposition Plan:   Status is: Inpatient  Remains inpatient  appropriate because:Inpatient level of care appropriate due to severity of illness   Dispo: The patient is from: Home              Anticipated d/c is to: Home on 5/13              Patient currently is not medically stable to d/c.   Difficult to place patient No  Consultants:    None  Procedures: None Antimicrobials:   Anti-infectives (From admission, onward)   None      Subjective: Patient was seen and examined at bedside.  Overnight events noted.   Patient continues to report having cough with white phlegm but denies any fever,  He reports difficulty breathing is improved, appears very anxious with coughing.  Objective: Vitals:   07/18/20 0145 07/18/20 0530 07/18/20 0752 07/18/20 1312  BP: (!) 137/91 140/85  (!) 155/96  Pulse: 80 82  (!) 102  Resp: 18 17  16   Temp: 98.1 F (36.7 C) 97.7 F (36.5 C)  97.7 F (36.5 C)  TempSrc: Oral Oral  Oral  SpO2: 97% (!) 89% 92% 96%    Intake/Output Summary (Last 24 hours) at 07/18/2020 1559 Last data filed at 07/18/2020 1000 Gross per 24 hour  Intake 339.38 ml  Output --  Net 339.38 ml   There were no vitals filed for this visit.  Examination:  General exam: Appears calm and comfortable , Appears very anxious. Respiratory system: Clear to auscultation. Respiratory effort normal. Cardiovascular system: S1 & S2 heard, RRR. No JVD, murmurs, rubs, gallops or clicks. No pedal edema. Gastrointestinal system: Abdomen is nondistended, soft and nontender. No organomegaly or masses felt.  Central nervous system: Alert and oriented. No focal neurological deficits. Extremities: Symmetric 5  x 5 power.  No edema, no cyanosis, no clubbing. Skin: No rashes, lesions or ulcers Psychiatry: Judgement and insight appear normal. Mood & affect appropriate.     Data Reviewed: I have personally reviewed following labs and imaging studies  CBC: Recent Labs  Lab 07/13/20 1141 07/15/20 1008 07/16/20 0537  WBC 4.9 8.0 7.3  NEUTROABS 2.6 2.8   --   HGB 15.0 14.6 13.0  HCT 42.3 42.4 36.3*  MCV 77.3* 78.7* 76.3*  PLT 303 282 266   Basic Metabolic Panel: Recent Labs  Lab 07/13/20 1141 07/15/20 1008 07/15/20 1522 07/16/20 0537  NA 136 132*  --  137  K 4.3 3.3*  --  4.2  CL 106 101  --  106  CO2 25 22  --  25  GLUCOSE 107* 117*  --  144*  BUN 8 12  --  11  CREATININE 0.79 0.72  --  0.72  CALCIUM 9.2 8.7*  --  8.7*  MG  --   --  2.0  --    GFR: CrCl cannot be calculated (Unknown ideal weight.). Liver Function Tests: Recent Labs  Lab 07/13/20 1141 07/16/20 0537  AST 22 18  ALT 24 23  ALKPHOS 69 64  BILITOT 0.7 0.5  PROT 7.4 6.6  ALBUMIN 3.8 3.5   No results for input(s): LIPASE, AMYLASE in the last 168 hours. No results for input(s): AMMONIA in the last 168 hours. Coagulation Profile: No results for input(s): INR, PROTIME in the last 168 hours. Cardiac Enzymes: No results for input(s): CKTOTAL, CKMB, CKMBINDEX, TROPONINI in the last 168 hours. BNP (last 3 results) No results for input(s): PROBNP in the last 8760 hours. HbA1C: No results for input(s): HGBA1C in the last 72 hours. CBG: No results for input(s): GLUCAP in the last 168 hours. Lipid Profile: No results for input(s): CHOL, HDL, LDLCALC, TRIG, CHOLHDL, LDLDIRECT in the last 72 hours. Thyroid Function Tests: No results for input(s): TSH, T4TOTAL, FREET4, T3FREE, THYROIDAB in the last 72 hours. Anemia Panel: No results for input(s): VITAMINB12, FOLATE, FERRITIN, TIBC, IRON, RETICCTPCT in the last 72 hours. Sepsis Labs: Recent Labs  Lab 07/15/20 1211 07/17/20 1046 07/17/20 1450 07/18/20 1042  LATICACIDVEN 3.0* 2.6* 3.1* 1.2    Recent Results (from the past 240 hour(s))  Blood culture (routine x 2)     Status: None (Preliminary result)   Collection Time: 07/15/20 10:15 AM   Specimen: BLOOD RIGHT HAND  Result Value Ref Range Status   Specimen Description   Final    BLOOD RIGHT HAND Performed at [facility], [address], [place], [place] [address]    Special Requests   Final    BOTTLES DRAWN AEROBIC AND ANAEROBIC Blood Culture adequate volume Performed at [facility], [address], [place], [place] [address]    Culture   Final    NO GROWTH 3 DAYS Performed at [facility], [address], [place], [place] [address]    Report Status PENDING  Incomplete  Resp Panel by RT-PCR (Flu A&B, Covid) Nasopharyngeal Swab     Status: None   Collection Time: 07/15/20 10:15 AM   Specimen: Nasopharyngeal Swab; Nasopharyngeal(NP) swabs in vial transport medium  Result Value Ref Range Status   SARS Coronavirus 2 by RT PCR NEGATIVE NEGATIVE Final    Comment: (NOTE) SARS-CoV-2 target nucleic acids are NOT DETECTED.  The SARS-CoV-2 RNA is generally detectable in upper respiratory specimens during the acute phase of infection.  The lowest concentration of SARS-CoV-2 viral copies this assay can detect is 138 copies/mL. A negative result does not preclude SARS-Cov-2 infection and should not be used as the sole basis for treatment or other patient management decisions. A negative result may occur with  improper specimen collection/handling, submission of specimen other than nasopharyngeal swab, presence of viral mutation(s) within the areas targeted by this assay, and inadequate number of viral copies(<138 copies/mL). A negative result must be combined with clinical observations, patient history, and epidemiological information. The expected result is Negative.  Fact Sheet for Patients:  BloggerCourse.com  Fact Sheet for Healthcare Providers:  SeriousBroker.it  This test is no t yet approved or cleared by the [place] FDA and  has been authorized for detection and/or diagnosis of SARS-CoV-2 by FDA under an Emergency Use Authorization (EUA). This EUA will remain  in effect (meaning this test can be used) for the duration of  the COVID-19 declaration under Section 564(b)(1) of the Act, 21 U.S.C.section 360bbb-3(b)(1), unless the authorization is terminated  or revoked sooner.       Influenza A by PCR NEGATIVE NEGATIVE Final   Influenza B by PCR NEGATIVE NEGATIVE Final    Comment: (NOTE) The Xpert Xpress SARS-CoV-2/FLU/RSV plus assay is intended as an aid in the diagnosis of influenza from Nasopharyngeal swab specimens and should not be used as a sole basis for treatment. Nasal washings and aspirates are unacceptable for Xpert Xpress SARS-CoV-2/FLU/RSV testing.  Fact Sheet for Patients: BloggerCourse.com  Fact Sheet for Healthcare Providers: SeriousBroker.it  This test is not yet approved or cleared by the [place] FDA and has been authorized for detection and/or diagnosis of SARS-CoV-2 by FDA under an Emergency Use Authorization (EUA). This EUA will remain in effect (meaning this test can be used) for the duration of the COVID-19 declaration under Section 564(b)(1) of the Act, 21 U.S.C. section 360bbb-3(b)(1), unless the authorization is terminated or revoked.  Performed at [facility], [address], [place], [place] [address]   Blood culture (routine x 2)     Status: None (Preliminary result)   Collection Time: 07/15/20 10:20 AM   Specimen: BLOOD  Result Value Ref Range Status   Specimen Description   Final    BLOOD RIGHT ANTECUBITAL Performed at [facility], [address] [place], [place] [address]    Special Requests   Final    BOTTLES DRAWN AEROBIC AND ANAEROBIC Blood Culture adequate volume Performed at [facility], [address] [place], [place] [address]    Culture   Final    NO GROWTH 3 DAYS Performed at [facility], [address], [place], [place] [address]    Report Status PENDING  Incomplete    Radiology Studies: No results found.   Scheduled  Meds: . budesonide  0.25 mg Nebulization BID  . famotidine  40 mg Oral QHS  . fluticasone  1 spray Each Nare Daily  . loratadine  10 mg Oral Daily  . montelukast  10 mg Oral QHS  . pantoprazole  40 mg Oral Q1200  . polyethylene glycol  17 g Oral Daily   Continuous Infusions:   LOS: 3 days    Time spent: 25 mins    Cipriano Bunker, MD [facility]   If 7PM-7AM, please contact night-coverage

## 2020-07-18 NOTE — Care Management Important Message (Signed)
Important Message  Patient Details IM Letter given to the Patient. Name: Alexander Black MRN: [id number] Date of Birth: 12-11-1965   Medicare Important Message Given:  Yes     Caren Macadam 07/18/2020, 11:28 AM

## 2020-07-18 NOTE — Progress Notes (Signed)
Nutrition Brief Note  Patient identified on the Malnutrition Screening Tool (MST) Report  Pt reports losing 20 lbs but this has been reported as intentional. No weights in chart.  Wt Readings from Last 15 Encounters:  No data found for Wt    There is no height or weight on file to calculate BMI.  Current diet order is regular, patient is consuming approximately 100% of meals at this time. Labs and medications reviewed.   No nutrition interventions warranted at this time. If nutrition issues arise, please consult RD.   Tilda Franco, MS, RD, LDN Inpatient Clinical Dietitian Contact information available via Amion

## 2020-07-18 NOTE — Consult Note (Addendum)
NAME:  Alexander Black, MRN:  [id number], DOB:  1965/06/26, LOS: 3 ADMISSION DATE:  07/15/2020, CONSULTATION DATE:  07/18/20 REFERRING MD:  Lucianne Muss, CHIEF COMPLAINT:  cough   History of Present Illness:  55 year old man presenting with recurrent paroxysmal coughs and choking sensations.  Issues started about 3 months ago when he accidentally inhaled weedkiller  Since has noticed intermittently in AM he has sensation of throat closing off following by paroxysmal coughing, copious thin sputum production  Has been diagnosed with pneumonia and given abx without resolution  Associated with these episodes are what sounds like stridor  Denies aspiration symptoms  Nonsmoker  Denies GERD symptoms  Symptoms are debilitating and causing him anxiety  Recently has gotten sensation that his lungs and chest are on fire  He has no pulmonary history, MMRC 0  Pertinent  Medical History  none  Significant Hospital Events: Including procedures, antibiotic start and stop dates in addition to other pertinent events   5/10 admitted Multiple ER, PCP, and urgent care visits noted since Feb  Interim History / Subjective:  consulted  Objective   Blood pressure 140/85, pulse 82, temperature 97.7 F (36.5 C), temperature source Oral, resp. rate 17, SpO2 92 %.        Intake/Output Summary (Last 24 hours) at 07/18/2020 1153 Last data filed at 07/17/2020 1757 Gross per 24 hour  Intake 99.38 ml  Output --  Net 99.38 ml   There were no vitals filed for this visit.  Examination: General: anxious man sitting up in bed HENT: malampatti 2, pharyngeal cobblestoning noted Lungs: clear, no stridor, no wheezing, no accessory muscle use Cardiovascular: RRR, ext warm Abdomen: soft, +BS Extremities: scar on R knee, no LE edema Neuro: moves all 4 ext to command Skin: no rashes  Labs/imaging that I havepersonally reviewed  (right click and "Reselect all SmartList Selections" daily)  No eosinophilia CT  benign in lungs, esophagus is full of air Chemistries benign except elevated lactate question B-effect of too much albuterol Home meds benign  Resolved Hospital Problem list   n/a  Assessment & Plan:  AM Cough paroxysms when sitting up in AM associated with stridor.  Totality of info seems to point toward a reflux-induced recurrent laryngitis.  Upper airway cough syndrome and VCD also could be contributing. - DC oral steroids, not helping his anxiety and sleep issues - Start nebulized pulmicort - Start singulair - Add famotidine to protonix - Stop subcutaneous heparin, low risk - Check esophagram, consider EGD if evidence of stricture - Will follow with you and establish close OP pulmonology f/u, may need ENT/laryngoscopy as op  Best practice (right click and "Reselect all SmartList Selections" daily)  Per primary  Labs   CBC: Recent Labs  Lab 07/13/20 1141 07/15/20 1008 07/16/20 0537  WBC 4.9 8.0 7.3  NEUTROABS 2.6 2.8  --   HGB 15.0 14.6 13.0  HCT 42.3 42.4 36.3*  MCV 77.3* 78.7* 76.3*  PLT 303 282 266    Basic Metabolic Panel: Recent Labs  Lab 07/13/20 1141 07/15/20 1008 07/15/20 1522 07/16/20 0537  NA 136 132*  --  137  K 4.3 3.3*  --  4.2  CL 106 101  --  106  CO2 25 22  --  25  GLUCOSE 107* 117*  --  144*  BUN 8 12  --  11  CREATININE 0.79 0.72  --  0.72  CALCIUM 9.2 8.7*  --  8.7*  MG  --   --  2.0  --    GFR: CrCl cannot be calculated (Unknown ideal weight.). Recent Labs  Lab 07/13/20 1141 07/15/20 1008 07/15/20 1008 07/15/20 1211 07/16/20 0537 07/17/20 1046 07/17/20 1450 07/18/20 1042  WBC 4.9 8.0  --   --  7.3  --   --   --   LATICACIDVEN  --  1.7   < > 3.0*  --  2.6* 3.1* 1.2   < > = values in this interval not displayed.    Liver Function Tests: Recent Labs  Lab 07/13/20 1141 07/16/20 0537  AST 22 18  ALT 24 23  ALKPHOS 69 64  BILITOT 0.7 0.5  PROT 7.4 6.6  ALBUMIN 3.8 3.5   No results for input(s): LIPASE, AMYLASE in the  last 168 hours. No results for input(s): AMMONIA in the last 168 hours.  ABG No results found for: PHART, PCO2ART, PO2ART, HCO3, TCO2, ACIDBASEDEF, O2SAT   Coagulation Profile: No results for input(s): INR, PROTIME in the last 168 hours.  Cardiac Enzymes: No results for input(s): CKTOTAL, CKMB, CKMBINDEX, TROPONINI in the last 168 hours.  HbA1C: No results found for: HGBA1C  CBG: No results for input(s): GLUCAP in the last 168 hours.  Review of Systems:    Positive Symptoms in bold:  Constitutional fevers, chills, weight loss (intentional, exercising), fatigue, anorexia, malaise  Eyes decreased vision, double vision, eye irritation  Ears, Nose, Mouth, Throat sore throat, trouble swallowing, sinus congestion  Cardiovascular chest pain, paroxysmal nocturnal dyspnea, lower ext edema, palpitations   Respiratory SOB, cough, DOE, hemoptysis, wheezing  Gastrointestinal nausea, vomiting, diarrhea  Genitourinary burning with urination, trouble urinating  Musculoskeletal joint aches, joint swelling, back pain  Integumentary  rashes, skin lesions  Neurological focal weakness, focal numbness, trouble speaking, headaches  Psychiatric depression, anxiety, confusion  Endocrine polyuria, polydipsia, cold intolerance, heat intolerance  Hematologic abnormal bruising, abnormal bleeding, unexplained nose bleeds  Allergic/Immunologic recurrent infections, hives, swollen lymph nodes     Past Medical History:  He,  has no past medical history on file.   Surgical History:   Past Surgical History:  Procedure Laterality Date  . LEG SURGERY       Social History:   reports that he has never smoked. He has never used smokeless tobacco. He reports previous alcohol use. He reports previous drug use.   Family History:  His family history is not on file.   Allergies No Known Allergies   Home Medications  Prior to Admission medications   Medication Sig Start Date End Date Taking?  Authorizing Provider  benzonatate (TESSALON) 100 MG capsule Take 1 capsule (100 mg total) by mouth 3 (three) times daily as needed for cough. 07/05/20  Yes Robinson, [place] N, PA-C  cefUROXime (CEFTIN) 500 MG tablet Take 1 tablet (500 mg total) by mouth 2 (two) times daily with a meal. 07/13/20  Yes Raspet, Erin K, PA-C  Cholecalciferol (VITAMIN D) 50 MCG (2000 UT) tablet Take 4,000 Units by mouth daily.   Yes [provider]  doxycycline (VIBRAMYCIN) 100 MG capsule Take 1 capsule (100 mg total) by mouth 2 (two) times daily. 07/13/20  Yes Raspet, Erin K, PA-C  fluticasone (FLONASE) 50 MCG/ACT nasal spray Place 1 spray into both nostrils daily as needed for allergies. 07/04/20  Yes [provider]  Multiple Vitamins-Minerals (ZINC PO) Take 1 tablet by mouth daily.   Yes [provider]  promethazine-dextromethorphan (PROMETHAZINE-DM) 6.25-15 MG/5ML syrup Take 5 mLs by mouth 3 (three) times daily as needed for cough.  07/13/20  Yes Raspet, Noberto Retort, PA-C  pseudoephedrine-guaifenesin (MUCINEX D) 60-600 MG 12 hr tablet Take 1 tablet by mouth every 12 (twelve) hours.   Yes [provider]  vitamin C (ASCORBIC ACID) 500 MG tablet Take 1,000 mg by mouth daily.   Yes [provider]  diclofenac (VOLTAREN) 75 MG EC tablet Take 1 tablet (75 mg total) by mouth 2 (two) times daily. Patient not taking: No sig reported 05/21/19   Elson Areas, PA-C  methocarbamol (ROBAXIN) 500 MG tablet Take 1 tablet (500 mg total) by mouth 4 (four) times daily. Patient not taking: Reported on 07/15/2020 05/21/19   Elson Areas, PA-C

## 2020-07-19 DIAGNOSIS — J9601 Acute respiratory failure with hypoxia: Secondary | ICD-10-CM | POA: Diagnosis not present

## 2020-07-19 DIAGNOSIS — R1319 Other dysphagia: Secondary | ICD-10-CM

## 2020-07-19 LAB — EXPECTORATED SPUTUM ASSESSMENT W GRAM STAIN, RFLX TO RESP C

## 2020-07-19 LAB — BASIC METABOLIC PANEL
Anion gap: 8 (ref 5–15)
BUN: 14 mg/dL (ref 6–20)
CO2: 25 mmol/L (ref 22–32)
Calcium: 8.9 mg/dL (ref 8.9–10.3)
Chloride: 103 mmol/L (ref 98–111)
Creatinine, Ser: 0.87 mg/dL (ref 0.61–1.24)
GFR, Estimated: >60 mL/min (ref >60)
Glucose, Bld: 93 mg/dL (ref 70–99)
Potassium: 3.5 mmol/L (ref 3.5–5.1)
Sodium: 136 mmol/L (ref 135–145)

## 2020-07-19 LAB — CBC
HCT: 43.1 % (ref 39.0–52.0)
Hemoglobin: 15.1 g/dL (ref 13.0–17.0)
MCH: 27.6 pg (ref 26.0–34.0)
MCHC: 35 g/dL (ref 30.0–36.0)
MCV: 78.8 fL — ABNORMAL LOW (ref 80.0–100.0)
Platelets: 317 10*3/uL (ref 150–400)
RBC: 5.47 MIL/uL (ref 4.22–5.81)
RDW: 15.3 % (ref 11.5–15.5)
WBC: 10.2 10*3/uL (ref 4.0–10.5)
nRBC: 0 % (ref 0.0–0.2)

## 2020-07-19 LAB — MAGNESIUM: Magnesium: 2.3 mg/dL (ref 1.7–2.4)

## 2020-07-19 LAB — PHOSPHORUS: Phosphorus: 4.3 mg/dL (ref 2.5–4.6)

## 2020-07-19 MED ORDER — SALINE SPRAY 0.65 % NA SOLN
1.0000 | NASAL | Status: DC | PRN
  Administered 2020-07-19: 1 via NASAL
  Filled 2020-07-19: qty 44

## 2020-07-19 MED ORDER — REVEFENACIN 175 MCG/3ML IN SOLN
175.0000 ug | Freq: Every day | RESPIRATORY_TRACT | Status: DC
  Administered 2020-07-19: 175 ug via RESPIRATORY_TRACT
  Filled 2020-07-19 (×2): qty 3

## 2020-07-19 MED ORDER — ALPRAZOLAM 0.5 MG PO TABS
0.5000 mg | ORAL_TABLET | Freq: Two times a day (BID) | ORAL | Status: DC | PRN
  Administered 2020-07-19 – 2020-07-20 (×3): 0.5 mg via ORAL
  Filled 2020-07-19 (×3): qty 1

## 2020-07-19 MED ORDER — GUAIFENESIN ER 600 MG PO TB12
600.0000 mg | ORAL_TABLET | Freq: Two times a day (BID) | ORAL | Status: DC
  Administered 2020-07-19 – 2020-07-20 (×3): 600 mg via ORAL
  Filled 2020-07-19 (×3): qty 1

## 2020-07-19 NOTE — Consult Note (Addendum)
This is a progress note    NAME:  Alexander Black, MRN:  [id number], DOB:  1966-01-11, LOS: 4 ADMISSION DATE:  07/15/2020, CONSULTATION DATE:  07/18/20 REFERRING MD:  Lucianne Muss, CHIEF COMPLAINT:  cough   History of Present Illness:  55 year old man presenting with recurrent paroxysmal coughs and choking sensations.  Issues started about 3 months ago when he accidentally inhaled weedkiller  Since has noticed intermittently in AM he has sensation of throat closing off following by paroxysmal coughing, copious thin sputum production  Has been diagnosed with pneumonia and given abx without resolution  Associated with these episodes are what sounds like stridor  Denies aspiration symptoms  Nonsmoker  Denies GERD symptoms  Symptoms are debilitating and causing him anxiety  Recently has gotten sensation that his lungs and chest are on fire  He has no pulmonary history, MMRC 0  Pertinent  Medical History  none  Significant Hospital Events: Including procedures, antibiotic start and stop dates in addition to other pertinent events   5/10 admitted Multiple ER, PCP, and urgent care visits noted since Feb  Interim History / Subjective:  Woak up again with "foamy secretions" and sensation of choking. Very anxious. Pulmicort seemed to make secretions worse.  Objective   Blood pressure (!) 132/103, pulse 89, temperature 98.8 F (37.1 C), resp. rate 15, SpO2 96 %.        Intake/Output Summary (Last 24 hours) at 07/19/2020 0845 Last data filed at 07/19/2020 0600 Gross per 24 hour  Intake 480 ml  Output 0 ml  Net 480 ml   There were no vitals filed for this visit.  Examination: General: anxious man sitting up in bed HENT: malampatti 2, pharyngeal cobblestoning stable, large tonsils Lungs: clear, no stridor, no wheezing, no accessory muscle use Cardiovascular: RRR, ext warm Abdomen: soft, +BS Extremities: scar on R knee, no LE edema Neuro: moves all 4 ext to command Skin: no  rashes  Labs/imaging that I havepersonally reviewed  (right click and "Reselect all SmartList Selections" daily)  Mild esophageal dysmotility on esophagram  Resolved Hospital Problem list   n/a  Assessment & Plan:  AM Cough paroxysms when sitting up in AM associated with feelings of choking.   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 We have ruled out significant structural esophageal issues He has no aspiration symptoms nor does timing fit with this He is maximally treated for occult GERD and sinus drainage His lungs are normal appearing on CT I have never heard stridor or wheezing to make me think there is any bronchospasm or VCD  - Reassurance again provided that this is nothing life threatening, he remains anxious and unsure - Will check sputum culture although suspect low yield - Switch pulmicort to yupelri to try to reduce these "foamy" secretions - Continue H2b, PPI, singulair, claritin, flonase  - Low dose xanax suggested by Dr. Lucianne Muss, agree - He needs a sleep study as OP - Will follow with you, tough case   Myrla Halsted MD [facility]  Prefer epic messenger for cross cover needs If after hours, please call E-link

## 2020-07-19 NOTE — Procedures (Signed)
Preop diagnosis: Throat clearing, cough Postop diagnosis: same Procedure: Transnasal fiberoptic laryngoscopy Surgeon: Jenne Pane Anesth: Topical with 4% lidocaine Compl: None Findings: Normal pharynx and larynx with normal vocal fold mobility and no significant inflammation, swelling, mass, etc. Description:  After discussing risks, benefits, and alternatives, the patient was placed in a seated position and the right nasal passage was sprayed with topical anesthetic.  The fiberoptic scope was passed through the right nasal passage to view the pharynx and larynx.  Findings are noted above.  The scope was then removed and he was returned to nursing care in stable condition.

## 2020-07-19 NOTE — Progress Notes (Signed)
PROGRESS NOTE    Alexander Black  IHK:[id number] DOB: 01-Jul-1965 DOA: 07/15/2020 PCP: Patient, No Pcp Per (Inactive)   Brief Narrative:  This 55 years old male with PMH significant for obesity, osteoarthritis who presented in the ED with shortness of breath.  Patient reports having shortness of breath associated with productive cough for last 10 days which has been getting worse,  he was treated as an outpatient with antibiotics with no resolution of his symptoms.  In the ED his chest xray shows bibasilar faint atelectasis,  CT chest negative for pulmonary embolism but no infiltrate.  Patient was admitted for acute hypoxic respiratory failure secondary to hyperactive airway disease.  Patient continued to have cough, started on bronchodilator therapy,  short courses of steroids and pantoprazole.    Assessment & Plan:   Active Problems:   Acute respiratory failure with hypoxia (HCC)  Acute hypoxic respiratory failure: >  Resolved. In the setting of reactive airway disease.   Patient was hypoxic on arrival in the ED,  requiring oxygen. He was successfully weaned down to room air. Continue nebulization as needed Continue  Flonase and  Delsym. Continue incentive spirometry and flutter valve. Patient will need outpatient PFTs.  Intermittent paroxysmal coughing spells: Patient reports intermittent coughing spells when sitting up in the morning associated with phlegym Pulmonology consulted, thinks it could be reflux induced recurrent laryngitis,  upper airway cough syndrome. Recommended nebulized Pulmicort, Singulair Pepcid. DC prednisone Esophagram showed no evidence of stricture. Patient might be having panic disorder and glottic hypersensitivity. There is no aspiration symptoms, lung exam normal. We will try low-dose Xanax 0.5 mg twice daily as needed. ENT consulted, fiberoptic exam of pharynx and larynx was unremarkable normal vocal fold mobility and no inflammation.   Hypokalemia:  Replaced and improved.  Obesity: Advised diet and exercise.  DVT prophylaxis: Lovenox Code Status: Full code Family Communication: No family at bedside Disposition Plan:   Status is: Inpatient  Remains inpatient appropriate because:Inpatient level of care appropriate due to severity of illness   Dispo: The patient is from: Home              Anticipated d/c is to: Home on 5/13              Patient currently is not medically stable to d/c.   Difficult to place patient No  Consultants:    None  Procedures: None Antimicrobials:   Anti-infectives (From admission, onward)   None      Subjective: Patient was seen and examined at bedside.  Overnight events noted.   Patient continues to report having cough with white phlegm but denies any fever,  He reports difficulty breathing is improved, appears very anxious with coughing.   Objective: Vitals:   07/18/20 1942 07/18/20 2158 07/19/20 0558 07/19/20 1030  BP:  128/85 (!) 132/103   Pulse:  88 89   Resp:  18 15   Temp:  98.4 F (36.9 C) 98.8 F (37.1 C)   TempSrc:  Oral    SpO2: 97% 91% 96% 94%    Intake/Output Summary (Last 24 hours) at 07/19/2020 1413 Last data filed at 07/19/2020 0600 Gross per 24 hour  Intake 240 ml  Output 0 ml  Net 240 ml   There were no vitals filed for this visit.  Examination:  General exam: Appears calm and comfortable , Appears very anxious. Respiratory system: Clear to auscultation. Respiratory effort normal. Cardiovascular system: S1 & S2 heard, RRR. No JVD, murmurs, rubs, gallops or clicks.  No pedal edema. Gastrointestinal system: Abdomen is nondistended, soft and nontender. No organomegaly or masses felt.  Central nervous system: Alert and oriented. No focal neurological deficits. Extremities: Symmetric 5 x 5 power.  No edema, no cyanosis, no clubbing. Skin: No rashes, lesions or ulcers Psychiatry: Judgement and insight appear normal. Mood & affect appropriate.     Data  Reviewed: I have personally reviewed following labs and imaging studies  CBC: Recent Labs  Lab 07/13/20 1141 07/15/20 1008 07/16/20 0537 07/19/20 0619  WBC 4.9 8.0 7.3 10.2  NEUTROABS 2.6 2.8  --   --   HGB 15.0 14.6 13.0 15.1  HCT 42.3 42.4 36.3* 43.1  MCV 77.3* 78.7* 76.3* 78.8*  PLT 303 282 266 317   Basic Metabolic Panel: Recent Labs  Lab 07/13/20 1141 07/15/20 1008 07/15/20 1522 07/16/20 0537 07/19/20 0619  NA 136 132*  --  137 136  K 4.3 3.3*  --  4.2 3.5  CL 106 101  --  106 103  CO2 25 22  --  25 25  GLUCOSE 107* 117*  --  144* 93  BUN 8 12  --  11 14  CREATININE 0.79 0.72  --  0.72 0.87  CALCIUM 9.2 8.7*  --  8.7* 8.9  MG  --   --  2.0  --  2.3  PHOS  --   --   --   --  4.3   GFR: CrCl cannot be calculated (Unknown ideal weight.). Liver Function Tests: Recent Labs  Lab 07/13/20 1141 07/16/20 0537  AST 22 18  ALT 24 23  ALKPHOS 69 64  BILITOT 0.7 0.5  PROT 7.4 6.6  ALBUMIN 3.8 3.5   No results for input(s): LIPASE, AMYLASE in the last 168 hours. No results for input(s): AMMONIA in the last 168 hours. Coagulation Profile: No results for input(s): INR, PROTIME in the last 168 hours. Cardiac Enzymes: No results for input(s): CKTOTAL, CKMB, CKMBINDEX, TROPONINI in the last 168 hours. BNP (last 3 results) No results for input(s): PROBNP in the last 8760 hours. HbA1C: No results for input(s): HGBA1C in the last 72 hours. CBG: No results for input(s): GLUCAP in the last 168 hours. Lipid Profile: No results for input(s): CHOL, HDL, LDLCALC, TRIG, CHOLHDL, LDLDIRECT in the last 72 hours. Thyroid Function Tests: No results for input(s): TSH, T4TOTAL, FREET4, T3FREE, THYROIDAB in the last 72 hours. Anemia Panel: No results for input(s): VITAMINB12, FOLATE, FERRITIN, TIBC, IRON, RETICCTPCT in the last 72 hours. Sepsis Labs: Recent Labs  Lab 07/15/20 1211 07/17/20 1046 07/17/20 1450 07/18/20 1042  LATICACIDVEN 3.0* 2.6* 3.1* 1.2    Recent  Results (from the past 240 hour(s))  Blood culture (routine x 2)     Status: None (Preliminary result)   Collection Time: 07/15/20 10:15 AM   Specimen: BLOOD RIGHT HAND  Result Value Ref Range Status   Specimen Description   Final    BLOOD RIGHT HAND Performed at [facility], [address], [place], [place] [address]    Special Requests   Final    BOTTLES DRAWN AEROBIC AND ANAEROBIC Blood Culture adequate volume Performed at [facility], [address], [place], [place] [address]    Culture   Final    NO GROWTH 3 DAYS Performed at [facility], [address] [place], [place] [address]    Report Status PENDING  Incomplete  Resp Panel by RT-PCR (Flu A&B, Covid) Nasopharyngeal Swab  Status: None   Collection Time: 07/15/20 10:15 AM   Specimen: Nasopharyngeal Swab; Nasopharyngeal(NP) swabs in vial transport medium  Result Value Ref Range Status   SARS Coronavirus 2 by RT PCR NEGATIVE NEGATIVE Final    Comment: (NOTE) SARS-CoV-2 target nucleic acids are NOT DETECTED.  The SARS-CoV-2 RNA is generally detectable in upper respiratory specimens during the acute phase of infection. The lowest concentration of SARS-CoV-2 viral copies this assay can detect is 138 copies/mL. A negative result does not preclude SARS-Cov-2 infection and should not be used as the sole basis for treatment or other patient management decisions. A negative result may occur with  improper specimen collection/handling, submission of specimen other than nasopharyngeal swab, presence of viral mutation(s) within the areas targeted by this assay, and inadequate number of viral copies(<138 copies/mL). A negative result must be combined with clinical observations, patient history, and epidemiological information. The expected result is Negative.  Fact Sheet for Patients:  BloggerCourse.comhttps://www.fda.gov/media/152166/download  Fact Sheet for Healthcare Providers:   SeriousBroker.ithttps://www.fda.gov/media/152162/download  This test is no t yet approved or cleared by the [place] FDA and  has been authorized for detection and/or diagnosis of SARS-CoV-2 by FDA under an Emergency Use Authorization (EUA). This EUA will remain  in effect (meaning this test can be used) for the duration of the COVID-19 declaration under Section 564(b)(1) of the Act, 21 U.S.C.section 360bbb-3(b)(1), unless the authorization is terminated  or revoked sooner.       Influenza A by PCR NEGATIVE NEGATIVE Final   Influenza B by PCR NEGATIVE NEGATIVE Final    Comment: (NOTE) The Xpert Xpress SARS-CoV-2/FLU/RSV plus assay is intended as an aid in the diagnosis of influenza from Nasopharyngeal swab specimens and should not be used as a sole basis for treatment. Nasal washings and aspirates are unacceptable for Xpert Xpress SARS-CoV-2/FLU/RSV testing.  Fact Sheet for Patients: BloggerCourse.comhttps://www.fda.gov/media/152166/download  Fact Sheet for Healthcare Providers: SeriousBroker.ithttps://www.fda.gov/media/152162/download  This test is not yet approved or cleared by the [place] FDA and has been authorized for detection and/or diagnosis of SARS-CoV-2 by FDA under an Emergency Use Authorization (EUA). This EUA will remain in effect (meaning this test can be used) for the duration of the COVID-19 declaration under Section 564(b)(1) of the Act, 21 U.S.C. section 360bbb-3(b)(1), unless the authorization is terminated or revoked.  Performed at [facility], [address] [place], [place] [address]   Blood culture (routine x 2)     Status: None (Preliminary result)   Collection Time: 07/15/20 10:20 AM   Specimen: BLOOD  Result Value Ref Range Status   Specimen Description   Final    BLOOD RIGHT ANTECUBITAL Performed at [facility], [address] [place], [place] [address]    Special Requests   Final    BOTTLES DRAWN AEROBIC AND ANAEROBIC Blood Culture  adequate volume Performed at [facility], [address] [place], [place] [address]    Culture   Final    NO GROWTH 3 DAYS Performed at [facility], [address] [place], [place] [address]    Report Status PENDING  Incomplete    Radiology Studies: DG ESOPHAGUS W SINGLE CM (SOL OR THIN BA)  Result Date: 07/18/2020 CLINICAL DATA:  55 year old male complaining of difficulty swallowing for 2 weeks. EXAM: ESOPHOGRAM / BARIUM SWALLOW / BARIUM TABLET STUDY TECHNIQUE: Combined double contrast and single contrast examination performed using effervescent crystals, thick barium liquid, and thin barium liquid. The patient was observed with  fluoroscopy swallowing a 13 mm barium sulphate tablet. FLUOROSCOPY TIME:  Fluoroscopy Time:  3 minutes 48 seconds Radiation Exposure Index (if provided by the fluoroscopic device): 122 mGy Number of Acquired Spot Images: 1689 COMPARISON:  None. FINDINGS: Patient was administered barium contrast orally during fluoroscopic evaluation. Thoracic esophagus appeared normal without mass, ulceration or other focal wall irregularity. Contrast moved promptly through the thoracic esophagus and into the stomach without evidence of obstruction or stricture. No hiatal hernia seen despite Valsalva maneuvers. No gastroesophageal reflux visualized with provocative maneuvers. There was mild esophageal dysmotility. Limited imaging of the stomach and proximal small bowel demonstrates no gross abnormality. A 13 mm barium tablet was administered and passed without delay into the stomach. IMPRESSION: 1.  No evidence obstruction or stricture in the thoracic esophagus. 2.  No hiatal hernia. 3.  No gastroesophageal reflux. 4.  Mild esophageal dysmotility. Electronically Signed   By: Emmaline Kluver M.D.   On: 07/18/2020 16:56     Scheduled Meds: . famotidine  40 mg Oral QHS  . fluticasone  1 spray Each Nare Daily  . guaiFENesin  600 mg Oral BID  . loratadine  10 mg  Oral Daily  . montelukast  10 mg Oral QHS  . pantoprazole  40 mg Oral Q1200  . polyethylene glycol  17 g Oral Daily  . revefenacin  175 mcg Nebulization Daily   Continuous Infusions:   LOS: 4 days    Time spent: 25 mins    Cipriano Bunker, MD [facility]   If 7PM-7AM, please contact night-coverage

## 2020-07-19 NOTE — Consult Note (Signed)
Reason for Consult: Throat clearing Referring Physician: Hospitalist  Alexander Black is an 55 y.o. male.  HPI: 55 year old male with three months of intermittent cough and difficulty clearing thick phlegm that has been worse in the past week including three urgent care/ER visits.  He was admitted 5/10 with same symptoms and found to be hypoxic.  Pulmonology evaluation has been non-specific.  Barium swallow was unremarkable.  Antibiotic and steroid therapy did not seem to be helpful.  Consultation was requested.  History reviewed. No pertinent past medical history.  Past Surgical History:  Procedure Laterality Date  . LEG SURGERY      History reviewed. No pertinent family history.  Social History:  reports that he has never smoked. He has never used smokeless tobacco. He reports previous alcohol use. He reports previous drug use.  Allergies: No Known Allergies  Medications: I have reviewed the patient's current medications.  Results for orders placed or performed during the hospital encounter of 07/15/20 (from the past 48 hour(s))  Lactic acid, plasma     Status: Abnormal   Collection Time: 07/17/20  2:50 PM  Result Value Ref Range   Lactic Acid, Venous 3.1 (HH) 0.5 - 1.9 mmol/L    Comment: CRITICAL VALUE NOTED.  VALUE IS CONSISTENT WITH PREVIOUSLY REPORTED AND CALLED VALUE. Performed at [facility], [address] [place], [place] [address]   Lactic acid, plasma     Status: None   Collection Time: 07/18/20 10:42 AM  Result Value Ref Range   Lactic Acid, Venous 1.2 0.5 - 1.9 mmol/L    Comment: Performed at [facility], [address], [place], [place] [address]  CBC     Status: Abnormal   Collection Time: 07/19/20  6:19 AM  Result Value Ref Range   WBC 10.2 4.0 - 10.5 K/uL   RBC 5.47 4.22 - 5.81 MIL/uL   Hemoglobin 15.1 13.0 - 17.0 g/dL   HCT 83.4 19.6 - 22.2 %   MCV 78.8 (L) 80.0 - 100.0 fL   MCH 27.6 26.0 - 34.0 pg   MCHC 35.0 30.0 -  36.0 g/dL   RDW 97.9 89.2 - 11.9 %   Platelets 317 150 - 400 K/uL   nRBC 0.0 0.0 - 0.2 %    Comment: Performed at [facility], [address], [place], [place] [address]  Magnesium     Status: None   Collection Time: 07/19/20  6:19 AM  Result Value Ref Range   Magnesium 2.3 1.7 - 2.4 mg/dL    Comment: Performed at [facility], [address], [place], [place] [address]  Phosphorus     Status: None   Collection Time: 07/19/20  6:19 AM  Result Value Ref Range   Phosphorus 4.3 2.5 - 4.6 mg/dL    Comment: Performed at [facility], [address], [place], [place] [address]  Basic metabolic panel     Status: None   Collection Time: 07/19/20  6:19 AM  Result Value Ref Range   Sodium 136 135 - 145 mmol/L   Potassium 3.5 3.5 - 5.1 mmol/L   Chloride 103 98 - 111 mmol/L   CO2 25 22 - 32 mmol/L   Glucose, Bld 93 70 - 99 mg/dL    Comment: Glucose reference range applies only to samples taken after fasting for at least 8 hours.   BUN 14 6 - 20 mg/dL   Creatinine, Ser 1.49 0.61 - 1.24 mg/dL  Calcium 8.9 8.9 - 10.3 mg/dL   GFR, Estimated >28 >41 mL/min    Comment: (NOTE) Calculated using the CKD-EPI Creatinine Equation (2021)    Anion gap 8 5 - 15    Comment: Performed at [facility], [address], [place], [place] [address]    DG ESOPHAGUS W SINGLE CM (SOL OR THIN BA)  Result Date: 07/18/2020 CLINICAL DATA:  55 year old male complaining of difficulty swallowing for 2 weeks. EXAM: ESOPHOGRAM / BARIUM SWALLOW / BARIUM TABLET STUDY TECHNIQUE: Combined double contrast and single contrast examination performed using effervescent crystals, thick barium liquid, and thin barium liquid. The patient was observed with fluoroscopy swallowing a 13 mm barium sulphate tablet. FLUOROSCOPY TIME:  Fluoroscopy Time:  3 minutes 48 seconds Radiation Exposure Index (if provided by the fluoroscopic device): 122 mGy Number of Acquired  Spot Images: 1689 COMPARISON:  None. FINDINGS: Patient was administered barium contrast orally during fluoroscopic evaluation. Thoracic esophagus appeared normal without mass, ulceration or other focal wall irregularity. Contrast moved promptly through the thoracic esophagus and into the stomach without evidence of obstruction or stricture. No hiatal hernia seen despite Valsalva maneuvers. No gastroesophageal reflux visualized with provocative maneuvers. There was mild esophageal dysmotility. Limited imaging of the stomach and proximal small bowel demonstrates no gross abnormality. A 13 mm barium tablet was administered and passed without delay into the stomach. IMPRESSION: 1.  No evidence obstruction or stricture in the thoracic esophagus. 2.  No hiatal hernia. 3.  No gastroesophageal reflux. 4.  Mild esophageal dysmotility. Electronically Signed   By: Emmaline Kluver M.D.   On: 07/18/2020 16:56    Review of Systems  HENT: Positive for postnasal drip.   Respiratory: Positive for cough.   All other systems reviewed and are negative.  Blood pressure (!) 132/103, pulse 89, temperature 98.8 F (37.1 C), resp. rate 15, SpO2 94 %. Physical Exam Constitutional:      Appearance: He is well-developed. He is obese.  HENT:     Head: Normocephalic and atraumatic.     Right Ear: External ear normal.     Left Ear: External ear normal.     Nose: Nose normal.     Mouth/Throat:     Mouth: Mucous membranes are moist.     Pharynx: Oropharynx is clear.  Eyes:     Extraocular Movements: Extraocular movements intact.     Conjunctiva/sclera: Conjunctivae normal.     Pupils: Pupils are equal, round, and reactive to light.  Cardiovascular:     Rate and Rhythm: Normal rate.  Pulmonary:     Effort: Pulmonary effort is normal.  Musculoskeletal:     Cervical back: Normal range of motion.  Skin:    General: Skin is warm and dry.  Neurological:     General: No focal deficit present.     Mental Status: He is  alert and oriented to person, place, and time.  Psychiatric:        Mood and Affect: Mood normal.        Behavior: Behavior normal.        Thought Content: Thought content normal.        Judgment: Judgment normal.     Assessment/Plan: Throat clearing and cough  Fiberoptic exam of the pharynx and larynx performed at the bedside was unremarkable with normal vocal fold mobility and no inflammation.  I agree with Dr. Katrinka Blazing that reflux seems to be the most likely explanation for his symptoms.  I have no new recommendations.  Christia Reading 07/19/2020, 12:24 PM

## 2020-07-20 DIAGNOSIS — J9601 Acute respiratory failure with hypoxia: Secondary | ICD-10-CM | POA: Diagnosis not present

## 2020-07-20 LAB — CULTURE, BLOOD (ROUTINE X 2)
Culture: NO GROWTH
Culture: NO GROWTH
Special Requests: ADEQUATE
Special Requests: ADEQUATE

## 2020-07-20 MED ORDER — ALPRAZOLAM 0.5 MG PO TABS: 0.5000 mg | ORAL_TABLET | Freq: Two times a day (BID) | ORAL | 0 refills | Status: DC | PRN

## 2020-07-20 MED ORDER — FAMOTIDINE 40 MG PO TABS: 40.0000 mg | ORAL_TABLET | Freq: Every day | ORAL | 1 refills | Status: DC

## 2020-07-20 MED ORDER — REVEFENACIN 175 MCG/3ML IN SOLN: 175.0000 ug | Freq: Every day | RESPIRATORY_TRACT | 0 refills | Status: DC

## 2020-07-20 MED ORDER — REVEFENACIN 175 MCG/3ML IN SOLN
175.0000 ug | Freq: Every day | RESPIRATORY_TRACT | Status: DC
  Administered 2020-07-20: 175 ug via RESPIRATORY_TRACT

## 2020-07-20 MED ORDER — MONTELUKAST SODIUM 10 MG PO TABS: 10.0000 mg | ORAL_TABLET | Freq: Every day | ORAL | 1 refills | Status: DC

## 2020-07-20 MED ORDER — GUAIFENESIN ER 600 MG PO TB12: 600.0000 mg | ORAL_TABLET | Freq: Two times a day (BID) | ORAL | 0 refills | Status: DC

## 2020-07-20 MED ORDER — SALINE SPRAY 0.65 % NA SOLN: 1.0000 | NASAL | 1 refills | Status: DC | PRN

## 2020-07-20 MED ORDER — GUAIFENESIN-DM 100-10 MG/5ML PO SYRP: 5.0000 mL | ORAL_SOLUTION | ORAL | 0 refills | Status: DC | PRN

## 2020-07-20 MED ORDER — LORATADINE 10 MG PO TABS: 10.0000 mg | ORAL_TABLET | Freq: Every day | ORAL | 0 refills | Status: DC

## 2020-07-20 MED ORDER — UMECLIDINIUM BROMIDE 62.5 MCG/INH IN AEPB
1.0000 | INHALATION_SPRAY | Freq: Every day | RESPIRATORY_TRACT | Status: DC
  Filled 2020-07-20: qty 7

## 2020-07-20 MED ORDER — PANTOPRAZOLE SODIUM 40 MG PO TBEC: 40.0000 mg | DELAYED_RELEASE_TABLET | Freq: Every day | ORAL | 1 refills | Status: DC

## 2020-07-20 NOTE — Progress Notes (Signed)
Patient discharged to home, discharge instructions reviewed with patient who verbalized understanding. Patient instructed on how to use nebulizer machine, patient verbalized understanding.

## 2020-07-20 NOTE — Progress Notes (Addendum)
    NAME:  Alexander Black, MRN:  [id number], DOB:  [birth year], LOS: 5 ADMISSION DATE:  07/15/2020, CONSULTATION DATE:  07/18/20 REFERRING MD:  Lucianne Muss, CHIEF COMPLAINT:  cough   History of Present Illness:  55 year old man presenting with recurrent paroxysmal coughs and choking sensations.  Issues started about 3 months ago when he accidentally inhaled weedkiller  Since has noticed intermittently in AM he has sensation of throat closing off following by paroxysmal coughing, copious thin sputum production  Has been diagnosed with pneumonia and given abx without resolution  Associated with these episodes are what sounds like stridor  Denies aspiration symptoms  Nonsmoker  Denies GERD symptoms  Symptoms are debilitating and causing him anxiety  Recently has gotten sensation that his lungs and chest are on fire  He has no pulmonary history, MMRC 0  Pertinent  Medical History  none  Significant Hospital Events: Including procedures, antibiotic start and stop dates in addition to other pertinent events   5/10 admitted Multiple ER, PCP, and urgent care visits noted since Feb  Interim History / Subjective:  Better this am with current regimen. Anxious.  Objective   Blood pressure (!) 160/100, pulse (!) 107, temperature 98 F (36.7 C), temperature source Oral, resp. rate 18, SpO2 98 %.       No intake or output data in the 24 hours ending 07/20/20 0757 There were no vitals filed for this visit.  Examination: General: NAD sitting up in bed HENT: malampatti 2, pharyngeal cobblestoning stable Lungs: clear, no stridor, no wheezing, no accessory muscle use Cardiovascular: RRR, ext warm Abdomen: soft, +BS Extremities: scar on R knee, no LE edema Neuro: moves all 4 ext to command, good strength Skin: no rashes  Labs/imaging that I havepersonally reviewed  (right click and "Reselect all SmartList Selections" daily)  Mild esophageal dysmotility on esophagram Normal  largyngoscopy  Resolved Hospital Problem list   n/a  Assessment & Plan:  AM Cough paroxysms when sitting up in AM associated with feelings of choking.   Working diagnosis is glottic irritation from GERD/sinus drainage complicated by panic disorder.  Beta agonists worsen anxiety Steroids worsen his secretions  Finally seems improved with with current regimen. Reassured him that dangerous etiologies for his breathing have been ruled out.  Would send home with - AM omeprazole 20, qHS pepcid 20 - Ocean spray BID PRN each nare - Singulair 10mg  qHS - Incruse (or spiriva whatever is cheaper) qHS - Clonazepam or alprazolam qAM and qHS PRN - Instructions to sleep on incline; lifestyle modifications for GERD available from - Guaifenesin 12hr 600mg  BID - Zyrtec 10mg  daily - Robitussin DM 5-54mL qAM PRN - Instructions for slow deep breaths when has choking sensation - Will arrange phone visit in next couple weeks with one of our Nps in clinic given his mobility issues  MD [facility]  Prefer epic messenger for cross cover needs If after hours, please call E-link

## 2020-07-20 NOTE — Discharge Summary (Signed)
Physician Discharge Summary  Alexander Black ZOX:[id number] DOB: 1965/06/01 DOA: 07/15/2020  PCP: Patient, No Pcp Per (Inactive)  Admit date: 07/15/2020   Discharge date: 07/20/2020  Admitted From: Home.  Disposition: Home.  Recommendations for Outpatient Follow-up:  1. Follow up with PCP in 1-2 weeks. 2. Please obtain BMP/CBC in one week.  Home Health: None. Equipment/Devices: None  Discharge Condition: Stable CODE STATUS:Full code Diet recommendation: Heart Healthy   Brief Summary / Hospital course: This 55 years old male with PMH significant for obesity, osteoarthritis who presented in the ED with shortness of breath.  Patient reports having shortness of breath associated with productive cough for last 10 days which has been getting worse,  he was treated as an outpatient with antibiotics with no resolution of his symptoms.  In the ED his chest xray shows bibasilar faint atelectasis,  CT chest negative for pulmonary embolism,  no infiltrate.   Patient was admitted for acute hypoxic respiratory failure secondary to hyperactive airway disease.  Patient continued to have cough, started on bronchodilator therapy,  short courses of steroids and pantoprazole.  Hypoxia has resolved,  patient weaned down to room air.   Patient's symptoms continued to persist.  Pulmonology was consulted.  Patient reports intermittent coughing spells when sitting up in the morning associated with phlegm.  Pulmonologist thinks this could be reflux induced recurrent laryngitis,  recommended nebulized Pulmicort,Singulair,  Pepcid.  Esophagogram was completed which was negative for  stricture or any obstruction.  ENT was consulted.  Flexible laryngoscopy was normal.  Patient was started on low-dose Xanax twice a day, pantoprazole 40 mg in the morning and Pepcid 40 mg before bedtime to help improve the symptoms.  Patient feels better,  Pulmonology signed off.  Patient wants to be discharged.  Discharge recommendations.  - AM  omeprazole 20, qHS pepcid 20 - Ocean spray BID PRN each nare - Singulair  qHS - Incruse (or spiriva whatever is cheaper) qHS - Alprazolam qAM and qHS PRN - Instructions to sleep on incline; lifestyle modifications for GERD available from https://www.glover-anderson.net/ - Guaifenesin 12hr  BID - Zyrtec  daily - Robitussin DM 5-80mL qAM PRN - Instructions for slow deep breaths when has choking sensation - Will arrange phone visit in next couple weeks with one of our Nps in clinic given his mobility issues    Discharge Diagnoses:  Active Problems:   Acute respiratory failure with hypoxia (HCC)  Acute hypoxic respiratory failure: >  Resolved. In the setting of reactive airway disease.   Patient was hypoxic on arrival in the ED,  requiring oxygen. He was successfully weaned down to room air. Continue nebulization as needed Continue  Flonase and  Delsym. Continue incentive spirometry and flutter valve. Patient will need outpatient PFTs.  Intermittent paroxysmal coughing spells: Patient reports intermittent coughing spells when sitting up in the morning associated with phlegym Pulmonology consulted, thinks it could be reflux induced recurrent laryngitis,  upper airway cough syndrome. Recommended nebulized Pulmicort, Singulair Pepcid. DC prednisone Esophagram showed no evidence of stricture. Patient might be having panic disorder and glottic hypersensitivity. There is no aspiration symptoms, lung exam normal. We will try low-dose Xanax 0.5 mg twice daily as needed. ENT consulted, fiberoptic exam of pharynx and larynx was unremarkable,  normal vocal fold mobility and no inflammation.   Hypokalemia: Replaced and improved.  Obesity: Advised diet and exercise.  Discharge Instructions  Discharge Instructions    Call MD for:  difficulty breathing, headache or visual disturbances   Complete by: As  directed    Call MD for:  persistant  dizziness or light-headedness   Complete by: As directed    Call MD for:  persistant nausea and vomiting   Complete by: As directed    Diet - low sodium heart healthy   Complete by: As directed    Diet Carb Modified   Complete by: As directed    Discharge instructions   Complete by: As directed    Advised to follow up PCP in one week. Advised to take xanax twice daily as needed.   Increase activity slowly   Complete by: As directed      Allergies as of 07/20/2020   No Known Allergies     Medication List    STOP taking these medications   benzonatate 100 MG capsule Commonly known as: TESSALON   cefUROXime 500 MG tablet Commonly known as: CEFTIN   diclofenac 75 MG EC tablet Commonly known as: VOLTAREN   doxycycline 100 MG capsule Commonly known as: VIBRAMYCIN   fluticasone 50 MCG/ACT nasal spray Commonly known as: FLONASE   methocarbamol 500 MG tablet Commonly known as: Robaxin   promethazine-dextromethorphan 6.25-15 MG/5ML syrup Commonly known as: PROMETHAZINE-DM   pseudoephedrine-guaifenesin 60-600 MG 12 hr tablet Commonly known as: MUCINEX D     TAKE these medications   ALPRAZolam 0.5 MG tablet Commonly known as: XANAX Take 1 tablet (0.5 mg total) by mouth 2 (two) times daily as needed for anxiety.   famotidine 40 MG tablet Commonly known as: PEPCID Take 1 tablet (40 mg total) by mouth at bedtime.   guaiFENesin 600 MG 12 hr tablet Commonly known as: MUCINEX Take 1 tablet (600 mg total) by mouth 2 (two) times daily.   guaiFENesin-dextromethorphan 100-10 MG/5ML syrup Commonly known as: ROBITUSSIN DM Take 5 mLs by mouth every 4 (four) hours as needed for cough.   loratadine 10 MG tablet Commonly known as: CLARITIN Take 1 tablet (10 mg total) by mouth daily. Start taking on: Jul 21, 2020   montelukast 10 MG tablet Commonly known as: SINGULAIR Take 1 tablet (10 mg total) by mouth at bedtime.   pantoprazole 40 MG tablet Commonly known as:  PROTONIX Take 1 tablet (40 mg total) by mouth daily at 12 noon.   revefenacin 175 MCG/3ML nebulizer solution Commonly known as: YUPELRI Take 3 mLs (175 mcg total) by nebulization daily. Start taking on: Jul 21, 2020   sodium chloride 0.65 % Soln nasal spray Commonly known as: OCEAN Place 1 spray into both nostrils as needed for congestion.   vitamin C 500 MG tablet Commonly known as: ASCORBIC ACID Take 1,000 mg by mouth daily.   Vitamin D 50 MCG (2000 UT) tablet Take 4,000 Units by mouth daily.   ZINC PO Take 1 tablet by mouth daily.       Follow-up Information    Bevelyn Ngo, NP Follow up in 1 week(s).   Specialty: Pulmonary Disease Why: We will call you regarding telephone appointment. Contact information: [address] [place] [address] [phone]              No Known Allergies  Consultations:  Pulmonology  ENT   Procedures/Studies: DG Chest 2 View  Result Date: 07/13/2020 CLINICAL DATA:  Persistent cough and dyspnea for 10 days, on antibiotic therapy EXAM: CHEST - 2 VIEW COMPARISON:  07/05/2020 chest radiograph. FINDINGS: Stable cardiomediastinal silhouette with normal heart size. No pneumothorax. No pleural effusion. No pulmonary edema. Increased mild patchy opacity overlying the  lower thoracic spine on the lateral view suggesting lower lobe pneumonia, probably right lower lobe. IMPRESSION: Increased mild patchy opacity overlying the lower thoracic spine on the lateral view, favor mild right lower lobe pneumonia. Chest radiograph follow-up advised. Electronically Signed   By: Delbert Phenix M.D.   On: 07/13/2020 12:05   DG Chest 2 View  Result Date: 07/05/2020 CLINICAL DATA:  55 year old male with cough. Questionable right lung base opacity on earlier portable chest. EXAM: CHEST - 2 VIEW COMPARISON:  Portable chest 0834 hours today. Chest radiographs 04/29/2020. FINDINGS: Stable lung volumes since February, improved on the lateral today.  Normal cardiac size and mediastinal contours. Visualized tracheal air column is within normal limits. Both lungs appear stable since February and clear. No pneumothorax or pleural effusion. Substantial lumbar scoliosis. Straightening of thoracic kyphosis. Negative visible bowel gas pattern. IMPRESSION: 1.  No cardiopulmonary abnormality. 2. Scoliosis. Electronically Signed   By: Odessa Fleming M.D.   On: 07/05/2020 10:33   CT Angio Chest PE W/Cm &/Or Wo Cm  Result Date: 07/15/2020 CLINICAL DATA:  Shortness of breath EXAM: CT ANGIOGRAPHY CHEST WITH CONTRAST TECHNIQUE: Multidetector CT imaging of the chest was performed using the standard protocol during bolus administration of intravenous contrast. Multiplanar CT image reconstructions and MIPs were obtained to evaluate the vascular anatomy. CONTRAST:  OMNIPAQUE IOHEXOL 350 MG/ML SOLN COMPARISON:  None. FINDINGS: Cardiovascular: No filling defects in the pulmonary arteries to suggest pulmonary emboli. Heart is normal size. Aorta is normal caliber. Mediastinum/Nodes: No mediastinal, hilar, or axillary adenopathy. Trachea and esophagus are unremarkable. Thyroid unremarkable. Lungs/Pleura: Lungs are clear. No focal airspace opacities or suspicious nodules. No effusions. Upper Abdomen: Imaging into the upper abdomen demonstrates no acute findings. Musculoskeletal: Chest wall soft tissues are unremarkable. No acute bony abnormality. Review of the MIP images confirms the above findings. IMPRESSION: No evidence of pulmonary embolus. No acute cardiopulmonary disease. Electronically Signed   By: Charlett Nose M.D.   On: 07/15/2020 12:13   DG Chest Portable 1 View  Result Date: 07/15/2020 CLINICAL DATA:  Shortness of breath, cough. EXAM: PORTABLE CHEST 1 VIEW COMPARISON:  Chest radiograph dated 07/13/2020. FINDINGS: The heart size and mediastinal contours are within normal limits. There is suggestion of mild bibasilar atelectasis/airspace disease. No pleural effusion or  pneumothorax. The visualized skeletal structures are unremarkable. IMPRESSION: Suggestion of mild bibasilar atelectasis/airspace disease. Electronically Signed   By: Romona Curls M.D.   On: 07/15/2020 11:23   DG Chest Port 1 View  Result Date: 07/05/2020 CLINICAL DATA:  55 year old with a cough. EXAM: PORTABLE CHEST 1 VIEW COMPARISON:  04/29/2020 FINDINGS: AP portable views of the chest were obtained. Increased densities along the medial right lung base compared to the previous examination and not clear if this is related to patient positioning and projection. Upper lungs are clear. Overall heart size is stable. IMPRESSION: Study has limitations. There may be increased densities at the medial right lung base that are nonspecific. If possible, consider repeat imaging with PA and lateral views of the chest. Electronically Signed   By: Richarda Overlie M.D.   On: 07/05/2020 09:03   DG ESOPHAGUS W SINGLE CM (SOL OR THIN BA)  Result Date: 07/18/2020 CLINICAL DATA:  55 year old male complaining of difficulty swallowing for 2 weeks. EXAM: ESOPHOGRAM / BARIUM SWALLOW / BARIUM TABLET STUDY TECHNIQUE: Combined double contrast and single contrast examination performed using effervescent crystals, thick barium liquid, and thin barium liquid. The patient was observed with fluoroscopy swallowing a 13 mm barium  sulphate tablet. FLUOROSCOPY TIME:  Fluoroscopy Time:  3 minutes 48 seconds Radiation Exposure Index (if provided by the fluoroscopic device): 122 mGy Number of Acquired Spot Images: 1689 COMPARISON:  None. FINDINGS: Patient was administered barium contrast orally during fluoroscopic evaluation. Thoracic esophagus appeared normal without mass, ulceration or other focal wall irregularity. Contrast moved promptly through the thoracic esophagus and into the stomach without evidence of obstruction or stricture. No hiatal hernia seen despite Valsalva maneuvers. No gastroesophageal reflux visualized with provocative maneuvers.  There was mild esophageal dysmotility. Limited imaging of the stomach and proximal small bowel demonstrates no gross abnormality. A 13 mm barium tablet was administered and passed without delay into the stomach. IMPRESSION: 1.  No evidence obstruction or stricture in the thoracic esophagus. 2.  No hiatal hernia. 3.  No gastroesophageal reflux. 4.  Mild esophageal dysmotility. Electronically Signed   By: Emmaline Kluver M.D.   On: 07/18/2020 16:56    (Echo, Carotid, EGD, Colonoscopy, ERCP)    Subjective: Patient was seen and examined at bedside.  Overnight events noted.  Patient reports feeling much improved.  Patient is being discharged home.  Discharge Exam: Vitals:   07/20/20 0500 07/20/20 0809  BP: (!) 160/100   Pulse: (!) 107   Resp: 18   Temp: 98 F (36.7 C)   SpO2: 98% 97%   Vitals:   07/19/20 1414 07/19/20 2057 07/20/20 0500 07/20/20 0809  BP: 116/85 120/81 (!) 160/100   Pulse: 100 83 (!) 107   Resp: Temp: 98.2 F (36.8 C) 98.1 F (36.7 C) 98 F (36.7 C)   TempSrc: Oral Oral Oral   SpO2: 93% 95% 98% 97%    General: Pt is alert, awake, not in acute distress Cardiovascular: RRR, S1/S2 +, no rubs, no gallops Respiratory: CTA bilaterally, no wheezing, no rhonchi Abdominal: Soft, NT, ND, bowel sounds + Extremities: no edema, no cyanosis    The results of significant diagnostics from this hospitalization (including imaging, microbiology, ancillary and laboratory) are listed below for reference.     Microbiology: Recent Results (from the past 240 hour(s))  Blood culture (routine x 2)     Status: None   Collection Time: 07/15/20 10:15 AM   Specimen: BLOOD RIGHT HAND  Result Value Ref Range Status   Specimen Description   Final    BLOOD RIGHT HAND Performed at [facility], [address] [place], [place] [address]    Special Requests   Final    BOTTLES DRAWN AEROBIC AND ANAEROBIC Blood Culture adequate volume Performed at [facility], [address], [place], [place] [address]    Culture   Final    NO GROWTH 5 DAYS Performed at [facility], [address], [place], [place] [address]    Report Status 07/20/2020 FINAL  Final  Resp Panel by RT-PCR (Flu A&B, Covid) Nasopharyngeal Swab     Status: None   Collection Time: 07/15/20 10:15 AM   Specimen: Nasopharyngeal Swab; Nasopharyngeal(NP) swabs in vial transport medium  Result Value Ref Range Status   SARS Coronavirus 2 by RT PCR NEGATIVE NEGATIVE Final    Comment: (NOTE) SARS-CoV-2 target nucleic acids are NOT DETECTED.  The SARS-CoV-2 RNA is generally detectable in upper respiratory specimens during the acute phase of infection. The lowest concentration of SARS-CoV-2 viral copies this assay can detect is 138 copies/mL. A negative result does not preclude SARS-Cov-2 infection and should not be used as the sole basis  for treatment or other patient management decisions. A negative result may occur with  improper specimen collection/handling, submission of specimen other than nasopharyngeal swab, presence of viral mutation(s) within the areas targeted by this assay, and inadequate number of viral copies(<138 copies/mL). A negative result must be combined with clinical observations, patient history, and epidemiological information. The expected result is Negative.  Fact Sheet for Patients:  BloggerCourse.com  Fact Sheet for Healthcare Providers:  SeriousBroker.it  This test is no t yet approved or cleared by the [place] FDA and  has been authorized for detection and/or diagnosis of SARS-CoV-2 by FDA under an Emergency Use Authorization (EUA). This EUA will remain  in effect (meaning this test can be used) for the duration of the COVID-19 declaration under Section 564(b)(1) of the Act, 21 U.S.C.section 360bbb-3(b)(1), unless the authorization is terminated  or revoked sooner.        Influenza A by PCR NEGATIVE NEGATIVE Final   Influenza B by PCR NEGATIVE NEGATIVE Final    Comment: (NOTE) The Xpert Xpress SARS-CoV-2/FLU/RSV plus assay is intended as an aid in the diagnosis of influenza from Nasopharyngeal swab specimens and should not be used as a sole basis for treatment. Nasal washings and aspirates are unacceptable for Xpert Xpress SARS-CoV-2/FLU/RSV testing.  Fact Sheet for Patients: BloggerCourse.com  Fact Sheet for Healthcare Providers: SeriousBroker.it  This test is not yet approved or cleared by the [place] FDA and has been authorized for detection and/or diagnosis of SARS-CoV-2 by FDA under an Emergency Use Authorization (EUA). This EUA will remain in effect (meaning this test can be used) for the duration of the COVID-19 declaration under Section 564(b)(1) of the Act, 21 U.S.C. section 360bbb-3(b)(1), unless the authorization is terminated or revoked.  Performed at [facility], [address], [place], [place] [address]   Blood culture (routine x 2)     Status: None   Collection Time: 07/15/20 10:20 AM   Specimen: BLOOD  Result Value Ref Range Status   Specimen Description   Final    BLOOD RIGHT ANTECUBITAL Performed at [facility], [address] [place], [place] [address]    Special Requests   Final    BOTTLES DRAWN AEROBIC AND ANAEROBIC Blood Culture adequate volume Performed at [facility], [address] [place], [place] [address]    Culture   Final    NO GROWTH 5 DAYS Performed at [facility], [address] [place], [place] [address]    Report Status 07/20/2020 FINAL  Final  Expectorated Sputum Assessment w Gram Stain, Rflx to Resp Cult     Status: None   Collection Time: 07/19/20  9:34 PM   Specimen: Expectorated Sputum  Result Value Ref Range Status   Specimen Description EXPECTORATED SPUTUM  Final    Special Requests NONE  Final   Sputum evaluation   Final    Sputum specimen not acceptable for testing.  Please recollect.   NOTIFIED CATHY RN @2253  ON 07/19/20 JACKSON,K Performed at [facility], [address] [place], [place]    Report Status 07/19/2020 FINAL  Final     Labs: BNP (last 3 results) Recent Labs    07/15/20 1008  BNP 17.6   Basic Metabolic Panel: Recent Labs  Lab 07/13/20 1141 07/15/20 1008 07/15/20 1522 07/16/20 0537 07/19/20 0619  NA 136 132*  --  137 136  K 4.3 3.3*  --  4.2 3.5  CL 106 101  --  106 103  CO2 25 22  --  25 25  GLUCOSE 107* 117*  --  144* 93  BUN 8 12  --  11 14  CREATININE 0.79 0.72  --  0.72 0.87  CALCIUM 9.2 8.7*  --  8.7* 8.9  MG  --   --  2.0  --  2.3  PHOS  --   --   --   --  4.3   Liver Function Tests: Recent Labs  Lab 07/13/20 1141 07/16/20 0537  AST 22 18  ALT 24 23  ALKPHOS 69 64  BILITOT 0.7 0.5  PROT 7.4 6.6  ALBUMIN 3.8 3.5   No results for input(s): LIPASE, AMYLASE in the last 168 hours. No results for input(s): AMMONIA in the last 168 hours. CBC: Recent Labs  Lab 07/13/20 1141 07/15/20 1008 07/16/20 0537 07/19/20 0619  WBC 4.9 8.0 7.3 10.2  NEUTROABS 2.6 2.8  --   --   HGB 15.0 14.6 13.0 15.1  HCT 42.3 42.4 36.3* 43.1  MCV 77.3* 78.7* 76.3* 78.8*  PLT 303 282 266 317   Cardiac Enzymes: No results for input(s): CKTOTAL, CKMB, CKMBINDEX, TROPONINI in the last 168 hours. BNP: Invalid input(s): POCBNP CBG: No results for input(s): GLUCAP in the last 168 hours. D-Dimer No results for input(s): DDIMER in the last 72 hours. Hgb A1c No results for input(s): HGBA1C in the last 72 hours. Lipid Profile No results for input(s): CHOL, HDL, LDLCALC, TRIG, CHOLHDL, LDLDIRECT in the last 72 hours. Thyroid function studies No results for input(s): TSH, T4TOTAL, T3FREE, THYROIDAB in the last 72 hours.  Invalid input(s): FREET3 Anemia work up No results for input(s):  VITAMINB12, FOLATE, FERRITIN, TIBC, IRON, RETICCTPCT in the last 72 hours. Urinalysis No results found for: COLORURINE, APPEARANCEUR, LABSPEC, PHURINE, GLUCOSEU, HGBUR, BILIRUBINUR, KETONESUR, PROTEINUR, UROBILINOGEN, NITRITE, LEUKOCYTESUR Sepsis Labs Invalid input(s): PROCALCITONIN,  WBC,  LACTICIDVEN Microbiology Recent Results (from the past 240 hour(s))  Blood culture (routine x 2)     Status: None   Collection Time: 07/15/20 10:15 AM   Specimen: BLOOD RIGHT HAND  Result Value Ref Range Status   Specimen Description   Final    BLOOD RIGHT HAND Performed at [facility], [address], [place], [place] [address]    Special Requests   Final    BOTTLES DRAWN AEROBIC AND ANAEROBIC Blood Culture adequate volume Performed at [facility], [address], [place], [place] [address]    Culture   Final    NO GROWTH 5 DAYS Performed at [facility], [address] [place] [address], [place], [place] [address]    Report Status 07/20/2020 FINAL  Final  Resp Panel by RT-PCR (Flu A&B, Covid) Nasopharyngeal Swab     Status: None   Collection Time: 07/15/20 10:15 AM   Specimen: Nasopharyngeal Swab; Nasopharyngeal(NP) swabs in vial transport medium  Result Value Ref Range Status   SARS Coronavirus 2 by RT PCR NEGATIVE NEGATIVE Final    Comment: (NOTE) SARS-CoV-2 target nucleic acids are NOT DETECTED.  The SARS-CoV-2 RNA is generally detectable in upper respiratory specimens during the acute phase of infection. The lowest concentration of SARS-CoV-2 viral copies this assay can detect is 138 copies/mL. A negative result does not preclude SARS-Cov-2 infection and should not be used as the sole basis for treatment or other patient management decisions. A negative result may occur with  improper specimen collection/handling, submission of specimen other than nasopharyngeal swab, presence of viral mutation(s) within the  areas targeted by this assay, and inadequate  number of viral copies(<138 copies/mL). A negative result must be combined with clinical observations, patient history, and epidemiological information. The expected result is Negative.  Fact Sheet for Patients:  BloggerCourse.comhttps://www.fda.gov/media/152166/download  Fact Sheet for Healthcare Providers:  SeriousBroker.ithttps://www.fda.gov/media/152162/download  This test is no t yet approved or cleared by the [place] FDA and  has been authorized for detection and/or diagnosis of SARS-CoV-2 by FDA under an Emergency Use Authorization (EUA). This EUA will remain  in effect (meaning this test can be used) for the duration of the COVID-19 declaration under Section 564(b)(1) of the Act, 21 U.S.C.section 360bbb-3(b)(1), unless the authorization is terminated  or revoked sooner.       Influenza A by PCR NEGATIVE NEGATIVE Final   Influenza B by PCR NEGATIVE NEGATIVE Final    Comment: (NOTE) The Xpert Xpress SARS-CoV-2/FLU/RSV plus assay is intended as an aid in the diagnosis of influenza from Nasopharyngeal swab specimens and should not be used as a sole basis for treatment. Nasal washings and aspirates are unacceptable for Xpert Xpress SARS-CoV-2/FLU/RSV testing.  Fact Sheet for Patients: BloggerCourse.comhttps://www.fda.gov/media/152166/download  Fact Sheet for Healthcare Providers: SeriousBroker.ithttps://www.fda.gov/media/152162/download  This test is not yet approved or cleared by the [place] FDA and has been authorized for detection and/or diagnosis of SARS-CoV-2 by FDA under an Emergency Use Authorization (EUA). This EUA will remain in effect (meaning this test can be used) for the duration of the COVID-19 declaration under Section 564(b)(1) of the Act, 21 U.S.C. section 360bbb-3(b)(1), unless the authorization is terminated or revoked.  Performed at [facility], [address] [place], [place] [address]   Blood culture (routine x 2)     Status: None   Collection Time: 07/15/20 10:20 AM    Specimen: BLOOD  Result Value Ref Range Status   Specimen Description   Final    BLOOD RIGHT ANTECUBITAL Performed at [facility], [address] [place], [place] [address]    Special Requests   Final    BOTTLES DRAWN AEROBIC AND ANAEROBIC Blood Culture adequate volume Performed at [facility], [address] [place], [place] [address]    Culture   Final    NO GROWTH 5 DAYS Performed at [facility], [address] [place], [place] [address]    Report Status 07/20/2020 FINAL  Final  Expectorated Sputum Assessment w Gram Stain, Rflx to Resp Cult     Status: None   Collection Time: 07/19/20  9:34 PM   Specimen: Expectorated Sputum  Result Value Ref Range Status   Specimen Description EXPECTORATED SPUTUM  Final   Special Requests NONE  Final   Sputum evaluation   Final    Sputum specimen not acceptable for testing.  Please recollect.   NOTIFIED CATHY RN @2253  ON 07/19/20 JACKSON,K Performed at [facility], [address] [place], [place] [address]    Report Status 07/19/2020 FINAL  Final     Time coordinating discharge: Over 30 minutes  SIGNED:   Cipriano BunkerPARDEEP Chrishaun Sasso, MD  [facility] 07/20/2020, 11:01 AM Pager   If 7PM-7AM, please contact night-coverage [email or web address]

## 2020-07-20 NOTE — TOC Initial Note (Signed)
Transition of Care [facility]) - Initial/Assessment Note    Patient Details  Name: Alexander Black MRN: [id number] Date of Birth: 09/24/1965  Transition of Care [facility]) CM/SW Contact:    Isaias Cowman, RN Phone Number: 07/20/2020, 12:25 PM  Clinical Narrative: 55 years old male with PMH significant for obesity and OA. Presented in the ED with SOB associated with productive cough for last 10 days which has been getting worse,he was treated as an outpt with antibiotics with no resolution of his symptoms. In the ED his chest xray shows bibasilar faint atelectasis. Pt was admitted for acute hypoxic respiratory failure secondary to hyperactive airway disease.   Received referral to assist pt with a nebulizer machine. Contacted [facility], spoke with Jazmine and she accepted the referral.  Pt reports that he lives alone. He plans to return home. He reports that he drives and he has a car. He needs assistance with transportation because he called EMS and his car is at home. Nurse reports that she will arrange the transportation.  He reports that he has medical insurance and he denies any issues filling his prescriptions.   Barriers to Discharge: No Barriers Identified   Patient Goals and CMS Choice Patient states their goals for this hospitalization and ongoing recovery are:: to get better      Expected Discharge Plan and Services     Discharge Planning Services: CM Consult Post Acute Care Choice: Durable Medical Equipment Living arrangements for the past 2 months: Single Family Home Expected Discharge Date: 07/20/20               DME Arranged: Nebulizer machine DME Agency: [facility] Date DME Agency Contacted: 07/20/20 Time DME Agency Contacted: 1220 Representative spoke with at DME Agency: Jazmine            Prior Living Arrangements/Services Living arrangements for the past 2 months: Single Family Home Lives with:: Self Patient language and need for  interpreter reviewed:: Yes Do you feel safe going back to the place where you live?: Yes               Activities of Daily Living Home Assistive Devices/Equipment: Wheelchair ADL Screening (condition at time of admission) Patient's cognitive ability adequate to safely complete daily activities?: Yes Is the patient deaf or have difficulty hearing?: No Does the patient have difficulty seeing, even when wearing glasses/contacts?: No Does the patient have difficulty concentrating, remembering, or making decisions?: No Patient able to express need for assistance with ADLs?: Yes Does the patient have difficulty dressing or bathing?: No Independently performs ADLs?: Yes (appropriate for developmental age) Does the patient have difficulty walking or climbing stairs?: Yes Weakness of Legs: Both Weakness of Arms/Hands: Both  Permission Sought/Granted Permission sought to share information with : Case Manager                Emotional Assessment Appearance:: Developmentally appropriate,Appears stated age Attitude/Demeanor/Rapport: Engaged,Gracious Affect (typically observed): Appropriate,Calm,Pleasant Orientation: : Oriented to Place,Oriented to Self,Oriented to  Time,Oriented to Situation,Fluctuating Orientation (Suspected and/or reported Sundowners)      Admission diagnosis:  Cough [R05.9] Tachycardia [R00.0] Acute respiratory failure with hypoxia (HCC) [J96.01] Patient Active Problem List   Diagnosis Date Noted  . Acute respiratory failure with hypoxia (HCC) 07/15/2020   PCP:  Patient, No Pcp Per (Inactive) Pharmacy:   [id number] G[place] Otto, [place] - [address] AT [facility] OF [facility] & [address] [place] [address] Phone:  [phone] Fax: [phone]     Social Determinants of Health (SDOH) Interventions    Readmission Risk Interventions No flowsheet data found.

## 2020-07-21 ENCOUNTER — Other Ambulatory Visit: Payer: Self-pay

## 2020-07-21 NOTE — Patient Outreach (Signed)
[facility]) Care Management  07/21/2020  Giovany Cosby 06-12-1965 [id number]   Referral Date: 07/21/20 Referral Source: [employer] Report Date of Discharge: 07/20/20 Facility:  [facility] Insurance: [facility]   Referral received.  No outreach warranted at this time.  Transition of Care calls being completed via EMMI.   Plan: RN CM will close case.    Bary Leriche, RN, MSN [facility] Care Management Care Management Coordinator Direct Line [phone] Toll Free: [phone]  Fax: [phone]

## 2020-07-22 ENCOUNTER — Other Ambulatory Visit: Payer: Self-pay

## 2020-07-22 NOTE — Patient Outreach (Signed)
[facility]) Care Management  07/22/2020  Alexander Black 22-Apr-1965 [id number]   Incoming call from [name] assistant with patient on the line. Patient states he was ordered a medication that is very expensive but [facility] is working on prescription. . Reviewed discharge papers. Patient ordered Revefenacin.  Patient verifies that Dr. Regino Schultze is the PCP.  Advised patient that if [facility] cannot get anything cheaper than that he would need to call PCP to order something else.  He verbalized understanding.    Plan: RN CM will close case.   Bary Leriche, RN, MSN [facility] Care Management Care Management Coordinator Direct Line [phone] Cell [phone] Toll Free: [phone]  Fax: [phone]

## 2020-07-23 ENCOUNTER — Other Ambulatory Visit: Payer: Self-pay

## 2020-07-23 ENCOUNTER — Encounter (HOSPITAL_COMMUNITY): Payer: Self-pay

## 2020-07-23 ENCOUNTER — Emergency Department (HOSPITAL_COMMUNITY): Payer: Medicare HMO

## 2020-07-23 ENCOUNTER — Emergency Department (HOSPITAL_COMMUNITY)
Admission: EM | Admit: 2020-07-23 | Discharge: 2020-07-24 | Disposition: A | Discharge status: Home / Self Care | Payer: Medicare HMO | Attending: Emergency Medicine | Admitting: Emergency Medicine

## 2020-07-23 DIAGNOSIS — R0600 Dyspnea, unspecified: Secondary | ICD-10-CM | POA: Diagnosis not present

## 2020-07-23 DIAGNOSIS — R198 Other specified symptoms and signs involving the digestive system and abdomen: Secondary | ICD-10-CM

## 2020-07-23 DIAGNOSIS — R0602 Shortness of breath: Secondary | ICD-10-CM | POA: Diagnosis not present

## 2020-07-23 DIAGNOSIS — R0989 Other specified symptoms and signs involving the circulatory and respiratory systems: Secondary | ICD-10-CM | POA: Insufficient documentation

## 2020-07-23 DIAGNOSIS — F458 Other somatoform disorders: Secondary | ICD-10-CM | POA: Diagnosis not present

## 2020-07-23 LAB — CBC WITH DIFFERENTIAL/PLATELET
Abs Immature Granulocytes: 0.02 10*3/uL (ref 0.00–0.07)
Basophils Absolute: 0 10*3/uL (ref 0.0–0.1)
Basophils Relative: 1 %
Eosinophils Absolute: 0.2 10*3/uL (ref 0.0–0.5)
Eosinophils Relative: 3 %
HCT: 38.7 % — ABNORMAL LOW (ref 39.0–52.0)
Hemoglobin: 13.3 g/dL (ref 13.0–17.0)
Immature Granulocytes: 0 %
Lymphocytes Relative: 41 %
Lymphs Abs: 2.5 10*3/uL (ref 0.7–4.0)
MCH: 27.4 pg (ref 26.0–34.0)
MCHC: 34.4 g/dL (ref 30.0–36.0)
MCV: 79.6 fL — ABNORMAL LOW (ref 80.0–100.0)
Monocytes Absolute: 0.7 10*3/uL (ref 0.1–1.0)
Monocytes Relative: 11 %
Neutro Abs: 2.8 10*3/uL (ref 1.7–7.7)
Neutrophils Relative %: 44 %
Platelets: 282 10*3/uL (ref 150–400)
RBC: 4.86 MIL/uL (ref 4.22–5.81)
RDW: 15.2 % (ref 11.5–15.5)
WBC: 6.1 10*3/uL (ref 4.0–10.5)
nRBC: 0 % (ref 0.0–0.2)

## 2020-07-23 LAB — BASIC METABOLIC PANEL WITH GFR
Anion gap: 5 (ref 5–15)
BUN: 11 mg/dL (ref 6–20)
CO2: 25 mmol/L (ref 22–32)
Calcium: 8.6 mg/dL — ABNORMAL LOW (ref 8.9–10.3)
Chloride: 104 mmol/L (ref 98–111)
Creatinine, Ser: 0.87 mg/dL (ref 0.61–1.24)
GFR, Estimated: >60 mL/min (ref >60)
Glucose, Bld: 94 mg/dL (ref 70–99)
Potassium: 4.2 mmol/L (ref 3.5–5.1)
Sodium: 134 mmol/L — ABNORMAL LOW (ref 135–145)

## 2020-07-23 LAB — BRAIN NATRIURETIC PEPTIDE: B Natriuretic Peptide: 7.9 pg/mL (ref 0.0–100.0)

## 2020-07-23 MED ORDER — AEROCHAMBER PLUS FLO-VU LARGE MISC: 1.0000 | Freq: Once | Status: DC

## 2020-07-23 MED ORDER — ALBUTEROL SULFATE HFA 108 (90 BASE) MCG/ACT IN AERS
2.0000 | INHALATION_SPRAY | Freq: Once | RESPIRATORY_TRACT | Status: AC
  Administered 2020-07-23: 2 via RESPIRATORY_TRACT
  Filled 2020-07-23: qty 6.7

## 2020-07-23 MED ORDER — ALUM & MAG HYDROXIDE-SIMETH 200-200-20 MG/5ML PO SUSP
30.0000 mL | Freq: Once | ORAL | Status: AC
  Administered 2020-07-23: 30 mL via ORAL
  Filled 2020-07-23: qty 30

## 2020-07-23 MED ORDER — LIDOCAINE VISCOUS HCL 2 % MT SOLN
15.0000 mL | Freq: Once | OROMUCOSAL | Status: AC
  Administered 2020-07-23: 15 mL via ORAL
  Filled 2020-07-23: qty 15

## 2020-07-23 NOTE — ED Notes (Signed)
Pt requested several times not to drink any water since he is here for shortness of breath. Pt seen numerous times to continue to drink water. Pt anxious in triage. Able to speak in full sentences, swallow own secretion.

## 2020-07-23 NOTE — ED Triage Notes (Signed)
Came back today for the same thing - sore throat, feels like there is something stuck in his throat and making him short of breath.   Recently d/c a few days ago.

## 2020-07-23 NOTE — ED Notes (Addendum)
Ambulating pulse recorded 105 O2 98%

## 2020-07-23 NOTE — ED Provider Notes (Signed)
[facility] EMERGENCY DEPARTMENT Provider Note   CSN: [id number] Arrival date & time: 07/23/20  1950     History Chief Complaint  Patient presents with  . Sore Throat  . Shortness of Breath    Alexander Black is a 55 y.o. male who presents to the ED with complaints of globus sensation that began tonight at 1900. Patient states he ate dinner around 1800, shrimp and rice, tolerated it well, then about 1 hour later started to have the sensation of something stuck in his throat (lower portion just above the sternal notch). Sensation constant since onset, gradually improving, very minimal now. Had associated dyspnea, felt like he was choking, and was trying to cough something up, but these have resolved. He has been keeping down fluids. Has had similar more mild sxs since recent hospital discharge. Denies fever, chills, emesis, chest pain, melena, hematochezia, or syncope.     HPI     History reviewed. No pertinent past medical history.  Patient Active Problem List   Diagnosis Date Noted  . Acute respiratory failure with hypoxia (HCC) 07/15/2020    Past Surgical History:  Procedure Laterality Date  . LEG SURGERY         History reviewed. No pertinent family history.  Social History   Tobacco Use  . Smoking status: Never Smoker  . Smokeless tobacco: Never Used  Substance Use Topics  . Alcohol use: Not Currently  . Drug use: Not Currently    Home Medications Prior to Admission medications   Medication Sig Start Date End Date Taking? Authorizing Provider  ALPRAZolam Prudy Feeler) 0.5 MG tablet Take 1 tablet (0.5 mg total) by mouth 2 (two) times daily as needed for anxiety. 07/20/20   Cipriano Bunker, MD  Cholecalciferol (VITAMIN D) 50 MCG (2000 UT) tablet Take 4,000 Units by mouth daily.    [provider]  famotidine (PEPCID) 40 MG tablet Take 1 tablet (40 mg total) by mouth at bedtime. 07/20/20   Cipriano Bunker, MD  guaiFENesin (MUCINEX) 600 MG 12 hr  tablet Take 1 tablet (600 mg total) by mouth 2 (two) times daily. 07/20/20   Cipriano Bunker, MD  guaiFENesin-dextromethorphan (ROBITUSSIN DM) 100-10 MG/5ML syrup Take 5 mLs by mouth every 4 (four) hours as needed for cough. 07/20/20   Cipriano Bunker, MD  loratadine (CLARITIN) 10 MG tablet Take 1 tablet (10 mg total) by mouth daily. 07/21/20   Cipriano Bunker, MD  montelukast (SINGULAIR) 10 MG tablet Take 1 tablet (10 mg total) by mouth at bedtime. 07/20/20   Cipriano Bunker, MD  Multiple Vitamins-Minerals (ZINC PO) Take 1 tablet by mouth daily.    [provider]  pantoprazole (PROTONIX) 40 MG tablet Take 1 tablet (40 mg total) by mouth daily at 12 noon. 07/20/20   Cipriano Bunker, MD  revefenacin (YUPELRI) 175 MCG/3ML nebulizer solution Take 3 mLs (175 mcg total) by nebulization daily. 07/21/20   Cipriano Bunker, MD  sodium chloride (OCEAN) 0.65 % SOLN nasal spray Place 1 spray into both nostrils as needed for congestion. 07/20/20   Cipriano Bunker, MD  vitamin C (ASCORBIC ACID) 500 MG tablet Take 1,000 mg by mouth daily.    [provider]    Allergies    Patient has no known allergies.  Review of Systems   Review of Systems  Constitutional: Negative for chills and fever.  HENT: Positive for trouble swallowing.   Respiratory: Positive for cough and shortness of breath.   Gastrointestinal: Negative for abdominal pain and vomiting.  Neurological: Negative for syncope.  All other systems reviewed and are negative.   Physical Exam Updated Vital Signs BP 131/83   Pulse 74   Temp (!) 97.4 F (36.3 C) (Oral)   Resp 20   Ht 5\' 2"  (1.575 m)   Wt 127 kg   SpO2 97%   BMI 51.21 kg/m   Physical Exam Vitals and nursing note reviewed.  Constitutional:      General: He is not in acute distress.    Appearance: He is well-developed. He is not toxic-appearing.  HENT:     Head: Normocephalic and atraumatic.     Mouth/Throat:     Pharynx: Oropharynx is clear.     Comments: Posterior  oropharynx is symmetric appearing. Patient tolerating own secretions without difficulty. No trismus. No drooling. No hot potato voice. No swelling beneath the tongue, submandibular compartment is soft.   Eyes:     General:        Right eye: No discharge.        Left eye: No discharge.     Conjunctiva/sclera: Conjunctivae normal.  Neck:     Comments: No palpable neck masses.  Cardiovascular:     Rate and Rhythm: Normal rate and regular rhythm.  Pulmonary:     Effort: Pulmonary effort is normal. No respiratory distress.     Breath sounds: Normal breath sounds. No stridor. No wheezing, rhonchi or rales.  Abdominal:     General: There is no distension.     Palpations: Abdomen is soft.     Tenderness: There is no abdominal tenderness.  Musculoskeletal:     Cervical back: Neck supple. No edema, erythema or rigidity.  Skin:    General: Skin is warm and dry.     Findings: No rash.  Neurological:     Mental Status: He is alert.     Comments: Clear speech.   Psychiatric:        Behavior: Behavior normal.     ED Results / Procedures / Treatments   Labs (all labs ordered are listed, but only abnormal results are displayed) Labs Reviewed  BASIC METABOLIC PANEL - Abnormal; Notable for the following components:      Result Value   Sodium 134 (*)    Calcium 8.6 (*)    All other components within normal limits  CBC WITH DIFFERENTIAL/PLATELET - Abnormal; Notable for the following components:   HCT 38.7 (*)    MCV 79.6 (*)    All other components within normal limits  BRAIN NATRIURETIC PEPTIDE    EKG None  Radiology DG Chest 2 View  Result Date: 07/23/2020 CLINICAL DATA:  Shortness of breath EXAM: CHEST - 2 VIEW COMPARISON:  07/15/2020 FINDINGS: Cardiac shadow is stable. The lungs are well aerated bilaterally. No focal infiltrate or sizable effusion is seen. No bony abnormality is noted. IMPRESSION: No acute abnormality noted. Electronically Signed   By: 09/14/2020 M.D.   On:  07/23/2020 21:32    Procedures Procedures   Medications Ordered in ED Medications  AeroChamber Plus Flo-Vu Large MISC 1 each (has no administration in time range)  albuterol (VENTOLIN HFA) 108 (90 Base) MCG/ACT inhaler 2 puff (2 puffs Inhalation Given 07/23/20 2230)    ED Course  I have reviewed the triage vital signs and the nursing notes.  Pertinent labs & imaging results that were available during my care of the patient were reviewed by me and considered in my medical decision making (see chart for details).  MDM Rules/Calculators/A&P                         Patient presents to the ED with complaints of globus sensation that occurred after dinner with choking/dyspnea- sxs overall resolved, remains with mild globus sensation. Nontoxic, vitals WNL, exam is fairly benign.   Additional history obtained:  Additional history obtained from chart review & nursing note review.  Admission 07/15/20-07/20/20- found to have acute hypoxic respiratory failure, CTA negative for PE or infiltrate, was admitted for suspected respiratory failure secondary to hyperactive airway disease. Continued to have cough, started on bronchodilator tx, steroids & pantoprazole, hypoxia resolved, but sxs persisted. Pulmonology was consulted- felt this could be reflux induced recurrent laryngitis,  recommended nebulized Pulmicort,Singulair,  Pepcid.  Esophagogram was completed which was negative for  stricture or any obstruction.  ENT was consulted.  Flexible laryngoscopy was normal.  Patient was started on low-dose Xanax twice a day, pantoprazole 40 mg in the morning and Pepcid 40 mg before bedtime to help improve the symptoms. Discharged home.  Lab Tests:  I reviewed and interpreted labs, which included:  CBC/BMP: unremarkable, mild hyponatremia/calcemia- BNP: unremarkable.   Imaging Studies ordered:  CXR ordered in triage, I independently reviewed, formal radiology impression shows:  No acute abnormality  noted.  ED Course:  Patient with reassuring labs & CXR.  On my initial assessment, sxs overall improving, he is tolerating his own secretions & PO liquids without difficulty in the ED. No signs of respiratory distress. No stridor. Will trial GI cocktail and ambulate with SPO2 monitoring.   00:15: RE-EVAL: Patient continues to rest comfortably, continues to be able to tolerate PO, and feels much better. Able to ambulate with SPO2 > 95% on RA. Overall appears appropriate for discharge. On further discussion he relays that these sxs occur after eating specifically, he is currently on PPI & pepcid, will trial carafate as well with GI follow up information. He is scheduled to see pulmonology tomorrow. I discussed results, treatment plan, need for follow-up, and return precautions with the patient. Provided opportunity for questions, patient confirmed understanding and is in agreement with plan.   Portions of this note were generated with Scientist, clinical (histocompatibility and immunogenetics). Dictation errors may occur despite best attempts at proofreading.  Findings and plan of care discussed with supervising physician Dr. Audley Hose who is in agreement.   Final Clinical Impression(s) / ED Diagnoses Final diagnoses:  Globus sensation    Rx / DC Orders ED Discharge Orders         Ordered    sucralfate (CARAFATE) 1 GM/10ML suspension  3 times daily with meals & bedtime        07/24/20 0022           Cherly Anderson, PA-C 07/24/20 0025    Cheryll Cockayne, MD 07/25/20 [phone]

## 2020-07-23 NOTE — ED Provider Notes (Signed)
Emergency Medicine Provider Triage Evaluation Note  Ronelle Smallman , a 55 y.o. male  was evaluated in triage.  Pt complains of SOB, globus sensation in throat since 5 pm. Recently discharged following admission for hypoxic respiratory failure. Some improvement at home with nebulizer. SOB worse after eating, 1 hour.  Reports sticky saliva in throat.   Review of Systems  Positive: SOB, globus sensation in throat Negative: Fevers, chills, N/V/D.   Physical Exam  BP (!) 114/95 (BP Location: Right Arm)   Pulse 76   Temp (!) 97.4 F (36.3 C) (Oral)   Resp 16   Ht 5\' 2"  (1.575 m)   Wt 127 kg   SpO2 97%   BMI 51.21 kg/m  Gen:   Awake, no distress   Resp:  Normal effort , wheezing in right lung base MSK:   Moves extremities without difficulty  Other:  Spitting repeatedly into emesis bag  Medical Decision Making  Medically screening exam initiated at 8:09 PM.  Appropriate orders placed.  Biagio Snelson was informed that the remainder of the evaluation will be completed by another provider, this initial triage assessment does not replace that evaluation, and the importance of remaining in the ED until their evaluation is complete.  This chart was dictated using voice recognition software, Dragon. Despite the best efforts of this provider to proofread and correct errors, errors may still occur which can change documentation meaning.    Minda Meo 07/23/20 2013    2014, MD 07/23/20 2109

## 2020-07-24 ENCOUNTER — Ambulatory Visit (INDEPENDENT_AMBULATORY_CARE_PROVIDER_SITE_OTHER): Payer: Medicare HMO | Admitting: Adult Health

## 2020-07-24 ENCOUNTER — Encounter: Payer: Self-pay | Admitting: Adult Health

## 2020-07-24 VITALS — BP 138/86 | HR 92 | Temp 97.6°F | Resp 18 | Ht 71.0 in | Wt 317.4 lb

## 2020-07-24 DIAGNOSIS — J387 Other diseases of larynx: Secondary | ICD-10-CM

## 2020-07-24 DIAGNOSIS — K219 Gastro-esophageal reflux disease without esophagitis: Secondary | ICD-10-CM | POA: Diagnosis not present

## 2020-07-24 DIAGNOSIS — R198 Other specified symptoms and signs involving the digestive system and abdomen: Secondary | ICD-10-CM | POA: Insufficient documentation

## 2020-07-24 DIAGNOSIS — R09A2 Foreign body sensation, throat: Secondary | ICD-10-CM

## 2020-07-24 DIAGNOSIS — R059 Cough, unspecified: Secondary | ICD-10-CM

## 2020-07-24 DIAGNOSIS — R0989 Other specified symptoms and signs involving the circulatory and respiratory systems: Secondary | ICD-10-CM | POA: Insufficient documentation

## 2020-07-24 MED ORDER — BENZONATATE 200 MG PO CAPS
200.0000 mg | ORAL_CAPSULE | Freq: Three times a day (TID) | ORAL | 1 refills | Status: DC | PRN
Start: 2020-07-24 — End: 2020-09-24

## 2020-07-24 MED ORDER — ALBUTEROL SULFATE (2.5 MG/3ML) 0.083% IN NEBU: 2.5000 mg | INHALATION_SOLUTION | Freq: Four times a day (QID) | RESPIRATORY_TRACT | 1 refills | Status: DC | PRN

## 2020-07-24 MED ORDER — SUCRALFATE 1 GM/10ML PO SUSP: 1.0000 g | Freq: Three times a day (TID) | ORAL | 0 refills | Status: DC

## 2020-07-24 NOTE — Patient Instructions (Addendum)
Continue on Claritin 10mg  daily Continue on Singulair 10mg  daily  Mucinex DM Twice daily   Tessalon Three times a day  As needed  Cough  GERD diet ,  Advance bland diet. Avoid rice or irritating foods.  Continue on Protonix 40mg  daily  Contine on Pepcid 20mg  At bedtime   Continue on daily  Add Albuterol Neb every 6hrs as needed.  Continue on Sulcarafate as directed.  Stress reducers .  Voice rest .  Follow up with Dr. or Shanayah Kaffenberger NP in 3 weeks with PFT and As needed   Please contact office for sooner follow up if symptoms do not improve or worsen or seek emergency care

## 2020-07-24 NOTE — Assessment & Plan Note (Signed)
Questionable etiology.  Esophagram was normal. Patient is advised to avoid spicy foods.  Had an episode after eating rice yesterday that resulted in emergency room visit.  Advised to avoid rice for a while.  Continue on GERD therapy. If continues will refer to GI.

## 2020-07-24 NOTE — Assessment & Plan Note (Signed)
Continue GERD therapy. Advised on a bland diet and advance as tolerated.

## 2020-07-24 NOTE — Assessment & Plan Note (Signed)
Globus sensation-with episodes of coughing and choking Hospital work-up with normal esophagram, ENT evaluation with laryngoscopy.  CT chest normal.  Will continue aggressive therapy at voice rest, GERD control And cough control. Consider GI referral if ongoing symptoms. In 3 weeks check PFTs. Continue therapy for chronic rhinitis.. Continue bronchodilators we will add an albuterol as needed. Plan  Patient Instructions  Continue on Claritin 10mg  daily Continue on Singulair 10mg  daily  Mucinex DM Twice daily   Tessalon Three times a day  As needed  Cough  GERD diet ,  Advance bland diet. Avoid rice or irritating foods.  Continue on Protonix 40mg  daily  Contine on Pepcid 20mg  At bedtime   Continue on daily  Add Albuterol Neb every 6hrs as needed.  Continue on Sulcarafate as directed.  Stress reducers .  Voice rest .  Follow up with Dr. or Mung Rinker NP in 3 weeks with PFT and As needed   Please contact office for sooner follow up if symptoms do not improve or worsen or seek emergency care

## 2020-07-24 NOTE — Progress Notes (Signed)
@Patient  ID: Alexander Black, male    DOB: 03/07/1966, 55 y.o.   MRN: [id number]  Chief Complaint  Patient presents with  . Follow-up    Referring provider: Truett PernaWang, Yun, MD  HPI: 55 year old male seen for pulmonary consult during hospitalization May 2022.  Patient was admitted with acute hypoxic respiratory failure secondary to hyperactive airway disease.  TEST/EVENTS :   .07/24/2020 Follow up : RAD  patient presents for a post hospital follow-up.  Patient was seen for pulmonary consult during hospitalization last week.  Patient presented with severe shortness of breath cough and hypoxemia.  Patient had 3 months of ongoing symptoms of recurrent paroxysmal coughing and choking sensations.  After coughing episodes felt that he had a sensation that his throat was closing off.  CT chest was negative for acute pulmonary issue including PE.  Patient was treated with course of steroids nebulized bronchodilators and GERD therapy.  Patient was seen by pulmonary for consult.  Felt to have possibly reactive airways and irritable larynx syndrome.  Felt this could be aggravated from reflux.  Work-up revealed a normal esophagram.  ENT was consulted.  Flexible laryngoscopy was normal.  ENT notes were reviewed and showed normal vocal cord mobility with no inflammation.  Patient was started on cough control regimen, Zyrtec, Prilosec and Pepcid.  Singulair.  He was felt to have a possible anxiety disorder that was aggravating his symptoms as well. He was started on Delmarupelri daily .  Patient went to the ER again yesterday due to a globus sensation.  He was given a GI cocktail.  Had no signs of hypoxemia. Says it occurred after eating dinner. Started on [employer]ul[employer]afate . Does feel better today but worried that these episodes will return.   Wheelchair bound for the most part, walks minimally . Congential left leg issues, multiple surgeries. Right knee replacement .  [place]. Does not travel a lot . 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 Never smoker . Has used Marajuana briefly in past for pain control .   Unsure if snoring . No daytime sleepiness, no restless sleep . Feels rested when he gets up.    No Known Allergies  Immunization History  Administered Date(s) Administered  . Influenza-Unspecified 12/18/2019  . PFIZER(Purple Top)SARS-COV-2 Vaccination 06/19/2019, 07/10/2019    History reviewed. No pertinent past medical history.  Tobacco History: Social History   Tobacco Use  Smoking Status Current Some Day Smoker  Smokeless Tobacco Never Used  Tobacco Comment   pt reports smoking marijuana occaisionally in [place] for orthopedic pain   Ready to quit: Not Answered Counseling given: Not Answered Comment: pt reports smoking marijuana occaisionally in [place] for orthopedic pain   Outpatient Medications Prior to Visit  Medication Sig Dispense Refill  . ALPRAZolam (XANAX) 0.5 MG tablet Take 1 tablet (0.5 mg total) by mouth 2 (two) times daily as needed for anxiety. 10 tablet 0  . Cholecalciferol (VITAMIN D) 50 MCG (2000 UT) tablet Take 4,000 Units by mouth daily.    . famotidine (PEPCID) 40 MG tablet Take 1 tablet (40 mg total) by mouth at bedtime. 30 tablet 1  . guaiFENesin (MUCINEX) 600 MG 12 hr tablet Take 1 tablet (600 mg total) by mouth 2 (two) times daily. 12 tablet 0  . guaiFENesin-dextromethorphan (ROBITUSSIN DM) 100-10 MG/5ML syrup Take 5 mLs by mouth every 4 (four) hours as needed for cough. 118 mL 0  . loratadine (CLARITIN) 10 MG tablet Take 1 tablet (10 mg  total) by mouth daily. 30 tablet 0  . montelukast (SINGULAIR) 10 MG tablet Take 1 tablet (10 mg total) by mouth at bedtime. 30 tablet 1  . Multiple Vitamins-Minerals (ZINC PO) Take 1 tablet by mouth daily.    . pantoprazole (PROTONIX) 40 MG tablet Take 1 tablet (40 mg total) by mouth daily at 12 noon. 30 tablet 1  . revefenacin (YUPELRI) 175 MCG/3ML nebulizer solution Take 3 mLs (175 mcg total) by nebulization  daily. 90 mL 0  . sodium chloride (OCEAN) 0.65 % SOLN nasal spray Place 1 spray into both nostrils as needed for congestion. 30 mL 1  . sucralfate (CARAFATE) 1 GM/10ML suspension Take 10 mLs (1 g total) by mouth 4 (four) times daily -  with meals and at bedtime. 420 mL 0  . vitamin C (ASCORBIC ACID) 500 MG tablet Take 1,000 mg by mouth daily.     No facility-administered medications prior to visit.     Review of Systems:   Constitutional:   No  weight loss, night sweats,  Fevers, chills, fatigue, or  lassitude.  HEENT:   No headaches,  Difficulty swallowing,  Tooth/dental problems, or  Sore throat,                No sneezing, itching, ear ache,  +nasal congestion, post nasal drip,   CV:  No chest pain,  Orthopnea, PND, swelling in lower extremities, anasarca, dizziness, palpitations, syncope.   GI  No heartburn, indigestion, abdominal pain, nausea, vomiting, diarrhea, change in bowel habits, loss of appetite, bloody stools.   Resp:  No chest wall deformity  Skin: no rash or lesions.  GU: no dysuria, change in color of urine, no urgency or frequency.  No flank pain, no hematuria   MS:  No joint pain or swelling.  No decreased range of motion.  No back pain.    Physical Exam  BP 138/86 (BP Location: Right Arm, Patient Position: Sitting, Cuff Size: Normal)   Pulse 92   Temp 97.6 F (36.4 C) (Skin)   Resp 18   Ht 5\' 11"  (1.803 m)   Wt (!) 317 lb 6.4 oz (144 kg)   SpO2 96%   BMI 44.27 kg/m   GEN: A/Ox3; pleasant , NAD, well nourished , in wc    HEENT:  [place]/AT,  NOSE-clear, THROAT-clear, no lesions, no postnasal drip or exudate noted.   NECK:  Supple w/ fair ROM; no JVD; normal carotid impulses w/o bruits; no thyromegaly or nodules palpated; no lymphadenopathy.    RESP  Clear  P & A; w/o, wheezes/ rales/ or rhonchi. no accessory muscle use, no dullness to percussion  CARD:  RRR, no m/r/g, no peripheral edema, pulses intact, no cyanosis or clubbing.  GI:   Soft & nt; nml  bowel sounds; no organomegaly or masses detected.   Musco: Warm bil, no deformities or joint swelling noted. Left brace on left lower leg/ankle   Neuro: alert, no focal deficits noted.  , wc   Skin: Warm, no lesions or rashes    Lab Results:  CBC    Component Value Date/Time   WBC 6.1 07/23/2020 2046   RBC 4.86 07/23/2020 2046   HGB 13.3 07/23/2020 2046   HCT 38.7 (L) 07/23/2020 2046   PLT 282 07/23/2020 2046   MCV 79.6 (L) 07/23/2020 2046   MCH 27.4 07/23/2020 2046   MCHC 34.4 07/23/2020 2046   RDW 15.2 07/23/2020 2046   LYMPHSABS 2.5 07/23/2020 2046   MONOABS 0.7 07/23/2020  2046   EOSABS 0.2 07/23/2020 2046   BASOSABS 0.0 07/23/2020 2046    BMET    Component Value Date/Time   NA 134 (L) 07/23/2020 2046   K 4.2 07/23/2020 2046   CL 104 07/23/2020 2046   CO2 25 07/23/2020 2046   GLUCOSE 94 07/23/2020 2046   BUN 11 07/23/2020 2046   CREATININE 0.87 07/23/2020 2046   CALCIUM 8.6 (L) 07/23/2020 2046   GFRNONAA >60 07/23/2020 2046    BNP    Component Value Date/Time   BNP 7.9 07/23/2020 2047    ProBNP No results found for: PROBNP  Imaging: DG Chest 2 View  Result Date: 07/23/2020 CLINICAL DATA:  Shortness of breath EXAM: CHEST - 2 VIEW COMPARISON:  07/15/2020 FINDINGS: Cardiac shadow is stable. The lungs are well aerated bilaterally. No focal infiltrate or sizable effusion is seen. No bony abnormality is noted. IMPRESSION: No acute abnormality noted. Electronically Signed   By: Alcide Clever M.D.   On: 07/23/2020 21:32   DG Chest 2 View  Result Date: 07/13/2020 CLINICAL DATA:  Persistent cough and dyspnea for 10 days, on antibiotic therapy EXAM: CHEST - 2 VIEW COMPARISON:  07/05/2020 chest radiograph. FINDINGS: Stable cardiomediastinal silhouette with normal heart size. No pneumothorax. No pleural effusion. No pulmonary edema. Increased mild patchy opacity overlying the lower thoracic spine on the lateral view suggesting lower lobe pneumonia, probably right lower  lobe. IMPRESSION: Increased mild patchy opacity overlying the lower thoracic spine on the lateral view, favor mild right lower lobe pneumonia. Chest radiograph follow-up advised. Electronically Signed   By: Delbert Phenix M.D.   On: 07/13/2020 12:05   DG Chest 2 View  Result Date: 07/05/2020 CLINICAL DATA:  55 year old male with cough. Questionable right lung base opacity on earlier portable chest. EXAM: CHEST - 2 VIEW COMPARISON:  Portable chest 0834 hours today. Chest radiographs 04/29/2020. FINDINGS: Stable lung volumes since February, improved on the lateral today. Normal cardiac size and mediastinal contours. Visualized tracheal air column is within normal limits. Both lungs appear stable since February and clear. No pneumothorax or pleural effusion. Substantial lumbar scoliosis. Straightening of thoracic kyphosis. Negative visible bowel gas pattern. IMPRESSION: 1.  No cardiopulmonary abnormality. 2. Scoliosis. Electronically Signed   By: Odessa Fleming M.D.   On: 07/05/2020 10:33   CT Angio Chest PE W/Cm &/Or Wo Cm  Result Date: 07/15/2020 CLINICAL DATA:  Shortness of breath EXAM: CT ANGIOGRAPHY CHEST WITH CONTRAST TECHNIQUE: Multidetector CT imaging of the chest was performed using the standard protocol during bolus administration of intravenous contrast. Multiplanar CT image reconstructions and MIPs were obtained to evaluate the vascular anatomy. CONTRAST:  OMNIPAQUE IOHEXOL 350 MG/ML SOLN COMPARISON:  None. FINDINGS: Cardiovascular: No filling defects in the pulmonary arteries to suggest pulmonary emboli. Heart is normal size. Aorta is normal caliber. Mediastinum/Nodes: No mediastinal, hilar, or axillary adenopathy. Trachea and esophagus are unremarkable. Thyroid unremarkable. Lungs/Pleura: Lungs are clear. No focal airspace opacities or suspicious nodules. No effusions. Upper Abdomen: Imaging into the upper abdomen demonstrates no acute findings. Musculoskeletal: Chest wall soft tissues are  unremarkable. No acute bony abnormality. Review of the MIP images confirms the above findings. IMPRESSION: No evidence of pulmonary embolus. No acute cardiopulmonary disease. Electronically Signed   By: Charlett Nose M.D.   On: 07/15/2020 12:13   DG Chest Portable 1 View  Result Date: 07/15/2020 CLINICAL DATA:  Shortness of breath, cough. EXAM: PORTABLE CHEST 1 VIEW COMPARISON:  Chest radiograph dated 07/13/2020. FINDINGS: The heart size and  mediastinal contours are within normal limits. There is suggestion of mild bibasilar atelectasis/airspace disease. No pleural effusion or pneumothorax. The visualized skeletal structures are unremarkable. IMPRESSION: Suggestion of mild bibasilar atelectasis/airspace disease. Electronically Signed   By: Romona Curls M.D.   On: 07/15/2020 11:23   DG Chest Port 1 View  Result Date: 07/05/2020 CLINICAL DATA:  55 year old with a cough. EXAM: PORTABLE CHEST 1 VIEW COMPARISON:  04/29/2020 FINDINGS: AP portable views of the chest were obtained. Increased densities along the medial right lung base compared to the previous examination and not clear if this is related to patient positioning and projection. Upper lungs are clear. Overall heart size is stable. IMPRESSION: Study has limitations. There may be increased densities at the medial right lung base that are nonspecific. If possible, consider repeat imaging with PA and lateral views of the chest. Electronically Signed   By: Richarda Overlie M.D.   On: 07/05/2020 09:03   DG ESOPHAGUS W SINGLE CM (SOL OR THIN BA)  Result Date: 07/18/2020 CLINICAL DATA:  55 year old male complaining of difficulty swallowing for 2 weeks. EXAM: ESOPHOGRAM / BARIUM SWALLOW / BARIUM TABLET STUDY TECHNIQUE: Combined double contrast and single contrast examination performed using effervescent crystals, thick barium liquid, and thin barium liquid. The patient was observed with fluoroscopy swallowing a 13 mm barium sulphate tablet. FLUOROSCOPY TIME:   Fluoroscopy Time:  3 minutes 48 seconds Radiation Exposure Index (if provided by the fluoroscopic device): 122 mGy Number of Acquired Spot Images: 1689 COMPARISON:  None. FINDINGS: Patient was administered barium contrast orally during fluoroscopic evaluation. Thoracic esophagus appeared normal without mass, ulceration or other focal wall irregularity. Contrast moved promptly through the thoracic esophagus and into the stomach without evidence of obstruction or stricture. No hiatal hernia seen despite Valsalva maneuvers. No gastroesophageal reflux visualized with provocative maneuvers. There was mild esophageal dysmotility. Limited imaging of the stomach and proximal small bowel demonstrates no gross abnormality. A 13 mm barium tablet was administered and passed without delay into the stomach. IMPRESSION: 1.  No evidence obstruction or stricture in the thoracic esophagus. 2.  No hiatal hernia. 3.  No gastroesophageal reflux. 4.  Mild esophageal dysmotility. Electronically Signed   By: Emmaline Kluver M.D.   On: 07/18/2020 16:56      No flowsheet data found.  No results found for: NITRICOXIDE      Assessment & Plan:   Irritable larynx syndrome Globus sensation-with episodes of coughing and choking Hospital work-up with normal esophagram, ENT evaluation with laryngoscopy.  CT chest normal.  Will continue aggressive therapy at voice rest, GERD control And cough control. Consider GI referral if ongoing symptoms. In 3 weeks check PFTs. Continue therapy for chronic rhinitis.. Continue bronchodilators we will add an albuterol as needed. Plan  Patient Instructions  Continue on Claritin 10mg  daily Continue on Singulair 10mg  daily  Mucinex DM Twice daily   Tessalon Three times a day  As needed  Cough  GERD diet ,  Advance bland diet. Avoid rice or irritating foods.  Continue on Protonix 40mg  daily  Contine on Pepcid 20mg  At bedtime   Continue on daily  Add Albuterol Neb every  6hrs as needed.  Continue on Sulcarafate as directed.  Stress reducers .  Voice rest .  Follow up with Dr. or Shamaine Mulkern NP in 3 weeks with PFT and As needed   Please contact office for sooner follow up if symptoms do not improve or worsen or seek emergency care  GERD (gastroesophageal reflux disease) Continue GERD therapy. Advised on a bland diet and advance as tolerated.  Globus sensation Questionable etiology.  Esophagram was normal. Patient is advised to avoid spicy foods.  Had an episode after eating rice yesterday that resulted in emergency room visit.  Advised to avoid rice for a while.  Continue on GERD therapy. If continues will refer to GI.     Rubye Oaks, NP 07/24/2020

## 2020-07-24 NOTE — Discharge Instructions (Addendum)
You were seen in the emergency department today due to sensation of something being stuck in her throat with choking after dinner.  Your chest x-ray was normal.  Your labs are overall reassuring.  We are sending you home with Carafate to take prior to meals and prior to bedtime.  We have prescribed you new medication(s) today. Discuss the medications prescribed today with your pharmacist as they can have adverse effects and interactions with your other medicines including over the counter and prescribed medications. Seek medical evaluation if you start to experience new or abnormal symptoms after taking one of these medicines, seek care immediately if you start to experience difficulty breathing, feeling of your throat closing, facial swelling, or rash as these could be indications of a more serious allergic reaction  Please continue to take the medications prescribed when you were discharged from the hospital.  Please follow-up with your lung specialist tomorrow.  We would also like you to follow-up with GI, please see attached information for low-power gastroenterology, call them today to make an appointment for soon as possible.  Return to the ER for new or worsening symptoms including but not limited to trouble swallowing, trouble breathing, inability to keep fluids down, chest pain, passing out, blood in vomit or stool, coughing up blood, or any other concerns.

## 2020-07-25 ENCOUNTER — Encounter (HOSPITAL_COMMUNITY): Payer: Self-pay | Admitting: Emergency Medicine

## 2020-07-25 ENCOUNTER — Telehealth: Payer: Self-pay | Admitting: Adult Health

## 2020-07-25 ENCOUNTER — Emergency Department (HOSPITAL_COMMUNITY): Payer: Medicare HMO

## 2020-07-25 ENCOUNTER — Other Ambulatory Visit: Payer: Self-pay

## 2020-07-25 ENCOUNTER — Emergency Department (HOSPITAL_COMMUNITY)
Admission: EM | Admit: 2020-07-25 | Discharge: 2020-07-25 | Disposition: A | Discharge status: Home / Self Care | Payer: Medicare HMO | Attending: Emergency Medicine | Admitting: Emergency Medicine

## 2020-07-25 DIAGNOSIS — R079 Chest pain, unspecified: Secondary | ICD-10-CM | POA: Insufficient documentation

## 2020-07-25 DIAGNOSIS — R198 Other specified symptoms and signs involving the digestive system and abdomen: Secondary | ICD-10-CM

## 2020-07-25 DIAGNOSIS — R1310 Dysphagia, unspecified: Secondary | ICD-10-CM

## 2020-07-25 DIAGNOSIS — R0602 Shortness of breath: Secondary | ICD-10-CM | POA: Diagnosis not present

## 2020-07-25 DIAGNOSIS — Z5321 Procedure and treatment not carried out due to patient leaving prior to being seen by health care provider: Secondary | ICD-10-CM | POA: Diagnosis not present

## 2020-07-25 DIAGNOSIS — R0989 Other specified symptoms and signs involving the circulatory and respiratory systems: Secondary | ICD-10-CM

## 2020-07-25 DIAGNOSIS — T17308D Unspecified foreign body in larynx causing other injury, subsequent encounter: Secondary | ICD-10-CM

## 2020-07-25 LAB — CBC
HCT: 38.3 % — ABNORMAL LOW (ref 39.0–52.0)
Hemoglobin: 13.5 g/dL (ref 13.0–17.0)
MCH: 27.4 pg (ref 26.0–34.0)
MCHC: 35.2 g/dL (ref 30.0–36.0)
MCV: 77.7 fL — ABNORMAL LOW (ref 80.0–100.0)
Platelets: 276 10*3/uL (ref 150–400)
RBC: 4.93 MIL/uL (ref 4.22–5.81)
RDW: 14.8 % (ref 11.5–15.5)
WBC: 7.4 10*3/uL (ref 4.0–10.5)
nRBC: 0 % (ref 0.0–0.2)

## 2020-07-25 LAB — BASIC METABOLIC PANEL
Anion gap: 10 (ref 5–15)
BUN: 8 mg/dL (ref 6–20)
CO2: 21 mmol/L — ABNORMAL LOW (ref 22–32)
Calcium: 8.4 mg/dL — ABNORMAL LOW (ref 8.9–10.3)
Chloride: 97 mmol/L — ABNORMAL LOW (ref 98–111)
Creatinine, Ser: 0.74 mg/dL (ref 0.61–1.24)
GFR, Estimated: >60 mL/min (ref >60)
Glucose, Bld: 104 mg/dL — ABNORMAL HIGH (ref 70–99)
Potassium: 3.2 mmol/L — ABNORMAL LOW (ref 3.5–5.1)
Sodium: 128 mmol/L — ABNORMAL LOW (ref 135–145)

## 2020-07-25 LAB — TROPONIN I (HIGH SENSITIVITY)
Troponin I (High Sensitivity): 4 ng/L (ref <18)
Troponin I (High Sensitivity): 4 ng/L (ref <18)

## 2020-07-25 NOTE — ED Notes (Signed)
Patient called for vitals x1 with no response 

## 2020-07-25 NOTE — Telephone Encounter (Signed)
Called patient , he is frustrated that he is not improving and continues to have Globus sensation .   Worse after taking Carafate and Albuterol   Stop Carafate and Albuterol  Refer to GI -stat referral for Dysphagia, Globus , choking.  Put on my scheduled 5/23 at 1200    Continue on Claritin 10mg  daily Continue on Singulair 10mg  daily  Mucinex DM Twice daily   Tessalon Three times a day  As needed  Cough  GERD diet Advance bland diet. Avoid rice or irritating foods.  Continue on Protonix 40mg  daily  Contine on Pepcid 20mg  At bedtime   Continue on daily    Please contact office for sooner follow up if symptoms do not improve or worsen or seek emergency care

## 2020-07-25 NOTE — ED Notes (Signed)
Family at bedside. 

## 2020-07-25 NOTE — ED Notes (Signed)
Pt handed me his stickers and state he was leaving

## 2020-07-25 NOTE — Telephone Encounter (Signed)
I called and spoke with patient in regards to message. I informed the patient that Babette Relic has not message back yet but she is aware of the message and will write back before the end of the day. The patient is very anxious and stated "he did not want to die" and we needed to hurry up to help him. I sent a message to his nurse on teams to remind tammy and she did. Will reach out once we hear back.

## 2020-07-25 NOTE — Progress Notes (Signed)
Patient called the answering service    Stated he was in the ED for over 7 hours and subsequently left before being seen  Encouraged to call the office to see if any help can be provided

## 2020-07-25 NOTE — Telephone Encounter (Signed)
Primary Pulmonologist: Dr. Judeth Horn Last office visit and with whom: Tammy on 07/24/2020 What do we see them for (pulmonary problems): Cough, Irritable larynx syndrome, GERD. Last OV assessment/plan: see below  Assessment & Plan:   Irritable larynx syndrome Globus sensation-with episodes of coughing and choking Hospital work-up with normal esophagram, ENT evaluation with laryngoscopy.  CT chest normal.  Will continue aggressive therapy at voice rest, GERD control And cough control. Consider GI referral if ongoing symptoms. In 3 weeks check PFTs. Continue therapy for chronic rhinitis.. Continue bronchodilators we will add an albuterol as needed. Plan  Patient Instructions  Continue on Claritin 10mg  daily Continue on Singulair 10mg  daily  Mucinex DM Twice daily   Tessalon Three times a day  As needed  Cough  GERD diet ,  Advance bland diet. Avoid rice or irritating foods.  Continue on Protonix 40mg  daily  Contine on Pepcid 20mg  At bedtime   Continue on daily  Add Albuterol Neb every 6hrs as needed.  Continue on Sulcarafate as directed.  Stress reducers .  Voice rest .  Follow up with Dr. or Parrett NP in 3 weeks with PFT and As needed   Please contact office for sooner follow up if symptoms do not improve or worsen or seek emergency care          Was appointment offered to patient (explain)?    Reason for call: patient called stating that he is still feeling something stuck in his throat and water/mucos is coming up. The neb meds that he was given on 07/24/20 has made the sensation worse. He went to the ER at 1am and was until 7am and stated he was never seen by a provider. He is requesting something to be done. Will route to Tammy.   Tammy, please advise on any recs. Thanks!  (examples of things to ask: : When did symptoms start? Fever? Cough? Productive? Color to sputum? More sputum than usual? Wheezing? Have you needed increased oxygen? Are  you taking your respiratory medications? What over the counter measures have you tried?)  No Known Allergies  Immunization History  Administered Date(s) Administered  . Influenza-Unspecified 12/18/2019  . PFIZER(Purple Top)SARS-COV-2 Vaccination 06/19/2019, 07/10/2019

## 2020-07-25 NOTE — ED Notes (Signed)
Pt was drinking water and complaining of choking , Tech suggested to  PT that he should stop drinking the water for now until he felt clear.   PT continued to drink the water while complaining of choking.  Tech reported to [employer].

## 2020-07-25 NOTE — ED Triage Notes (Addendum)
Patient reports persistent right upper chest pain with SOB , chest congestion and tenacious phlegm " feels like something in my throat" onset this week . No fever or chills .

## 2020-07-26 DIAGNOSIS — J45909 Unspecified asthma, uncomplicated: Secondary | ICD-10-CM | POA: Insufficient documentation

## 2020-07-28 ENCOUNTER — Other Ambulatory Visit: Payer: Self-pay

## 2020-07-28 ENCOUNTER — Inpatient Hospital Stay: Payer: Medicare HMO | Admitting: Adult Health

## 2020-07-28 ENCOUNTER — Ambulatory Visit (INDEPENDENT_AMBULATORY_CARE_PROVIDER_SITE_OTHER): Payer: Medicare HMO | Admitting: Adult Health

## 2020-07-28 ENCOUNTER — Encounter: Payer: Self-pay | Admitting: Adult Health

## 2020-07-28 DIAGNOSIS — J4520 Mild intermittent asthma, uncomplicated: Secondary | ICD-10-CM | POA: Diagnosis not present

## 2020-07-28 DIAGNOSIS — J45909 Unspecified asthma, uncomplicated: Secondary | ICD-10-CM | POA: Insufficient documentation

## 2020-07-28 DIAGNOSIS — R198 Other specified symptoms and signs involving the digestive system and abdomen: Secondary | ICD-10-CM | POA: Diagnosis not present

## 2020-07-28 DIAGNOSIS — J387 Other diseases of larynx: Secondary | ICD-10-CM

## 2020-07-28 DIAGNOSIS — R0989 Other specified symptoms and signs involving the circulatory and respiratory systems: Secondary | ICD-10-CM

## 2020-07-28 DIAGNOSIS — K219 Gastro-esophageal reflux disease without esophagitis: Secondary | ICD-10-CM | POA: Diagnosis not present

## 2020-07-28 NOTE — Progress Notes (Signed)
@Patient  ID: Alexander Black, male    DOB: 01/14/1966, 55 y.o.   MRN: [id number]  Chief Complaint  Patient presents with  . Follow-up    Referring provider: Truett PernaWang, Yun, MD  HPI: 55 year old male seen for pulmonary consult during hospitalization May 2022.  Patient was admitted with acute hypoxic respiratory failure secondary to hyperactive airway disease Patient is wheelchair-bound for the most part.  He walks minimally.  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  And previous right knee replacement.   TEST/EVENTS :   Social history.  Patient is [place].  Does not travel a lot.  Former Naval architecttruck driver.  Disabled in 2012 due to back and leg issues.  Never smoker.  Has used marijuana briefly in the past for pain control.   07/28/2020 Follow up : RAD , GERD Irritable larynx syndrome /Globus  Patient returns for a follow-up visit.  Patient was recently hospitalized for acute hypoxic respiratory failure secondary to hyperactive airways disease.  Patient has been having prior to admission 3 months of ongoing symptoms of recurrent coughing and choking sensations.  Patient had a severe episode recently and was admitted to the hospital.  CT chest was negative for acute pulmonary issues.  Negative for PE.  He was treated with a course of steroids, nebulized bronchodilators and GERD therapy.  He was seen by our pulmonary group.  Felt to have possible reactive airways with irritable larynx syndrome.  Felt this was aggravated by reflux.  Work-up revealed a esophagram with no significant stricture.  Only mild esophageal dilatation.Marland Kitchen.  ENT was consulted.  Flexible laryngoscopy was normal.  ENT notes reviewed and showed normal cord mobility with no inflammation.  He was started on a cough control regimen with Zyrtec Prilosec Pepcid and Singulair.  Placed on Yupelri nebulizer daily. Since discharge patient has went back to the emergency room twice with episodes of a globus sensation.  Patient  was recently started on Carafate and albuterol nebulizer but after using these says that it made his symptoms worse.  Patient was instructed to hold these medications.  Continue on aggressive cough suppression regimen and GERD therapy.  Patient says over the last 2 days he is better.  He is using Robitussin-DM every 4 hours that seems to soothe his throat and managed to cough and thick mucus.  He says he does have mucus most days it is cloudy to white.  Does feel that he feels better than a few days ago.  He also remains on Protonix and Pepcid. But He denies any chest pain orthopnea PND or increased leg swelling.  No Known Allergies  Immunization History  Administered Date(s) Administered  . Influenza-Unspecified 12/18/2019  . PFIZER(Purple Top)SARS-COV-2 Vaccination 06/19/2019, 07/10/2019    History reviewed. No pertinent past medical history.  Tobacco History: Social History   Tobacco Use  Smoking Status Current Some Day Smoker  Smokeless Tobacco Never Used  Tobacco Comment   pt reports smoking marijuana occaisionally in [place] for orthopedic pain   Ready to quit: Yes Counseling given: Yes Comment: pt reports smoking marijuana occaisionally in [place] for orthopedic pain   Outpatient Medications Prior to Visit  Medication Sig Dispense Refill  . albuterol (PROVENTIL) (2.5 MG/3ML) 0.083% nebulizer solution Take 3 mLs (2.5 mg total) by nebulization every 6 (six) hours as needed for wheezing or shortness of breath. 75 mL 1  . ALPRAZolam (XANAX) 0.5 MG tablet Take 1 tablet (0.5 mg total) by mouth 2 (two) times daily as needed for  anxiety. 10 tablet 0  . benzonatate (TESSALON) 200 MG capsule Take 1 capsule (200 mg total) by mouth 3 (three) times daily as needed for cough. 30 capsule 1  . Cholecalciferol (VITAMIN D) 50 MCG (2000 UT) tablet Take 4,000 Units by mouth daily.    . famotidine (PEPCID) 40 MG tablet Take 1 tablet (40 mg total) by mouth at bedtime. 30 tablet 1  . guaiFENesin (MUCINEX)  600 MG 12 hr tablet Take 1 tablet (600 mg total) by mouth 2 (two) times daily. 12 tablet 0  . guaiFENesin-dextromethorphan (ROBITUSSIN DM) 100-10 MG/5ML syrup Take 5 mLs by mouth every 4 (four) hours as needed for cough. 118 mL 0  . loratadine (CLARITIN) 10 MG tablet Take 1 tablet (10 mg total) by mouth daily. 30 tablet 0  . montelukast (SINGULAIR) 10 MG tablet Take 1 tablet (10 mg total) by mouth at bedtime. 30 tablet 1  . Multiple Vitamins-Minerals (ZINC PO) Take 1 tablet by mouth daily.    . pantoprazole (PROTONIX) 40 MG tablet Take 1 tablet (40 mg total) by mouth daily at 12 noon. 30 tablet 1  . revefenacin (YUPELRI) 175 MCG/3ML nebulizer solution Take 3 mLs (175 mcg total) by nebulization daily. 90 mL 0  . sodium chloride (OCEAN) 0.65 % SOLN nasal spray Place 1 spray into both nostrils as needed for congestion. 30 mL 1  . sucralfate (CARAFATE) 1 GM/10ML suspension Take 10 mLs (1 g total) by mouth 4 (four) times daily -  with meals and at bedtime. 420 mL 0  . vitamin C (ASCORBIC ACID) 500 MG tablet Take 1,000 mg by mouth daily.     No facility-administered medications prior to visit.     Review of Systems:   Constitutional:   No  weight loss, night sweats,  Fevers, chills, fatigue, or  lassitude.  HEENT:   No headaches,  Difficulty swallowing,  Tooth/dental problems, or  Sore throat,                No sneezing, itching, ear ache, nasal congestion, post nasal drip,   CV:  No chest pain,  Orthopnea, PND, swelling in lower extremities, anasarca, dizziness, palpitations, syncope.   GI  No heartburn, indigestion, abdominal pain, nausea, vomiting, diarrhea, change in bowel habits, loss of appetite, bloody stools.   Resp: No shortness of breath with exertion or at rest.  No excess mucus, no productive cough,  No non-productive cough,  No coughing up of blood.  No change in color of mucus.  No wheezing.  No chest wall deformity  Skin: no rash or lesions.  GU: no dysuria, change in color of  urine, no urgency or frequency.  No flank pain, no hematuria   MS:  No joint pain or swelling.  No decreased range of motion.  No back pain.    Physical Exam  BP 136/78 (BP Location: Right Arm, Cuff Size: Normal)   Pulse (!) 113   Temp (!) 97.1 F (36.2 C) (Temporal)   Ht 5\' 11"  (1.803 m)   Wt (!) 314 lb 9.6 oz (142.7 kg)   SpO2 96%   BMI 43.88 kg/m   GEN: A/Ox3; pleasant , NAD, well nourished    HEENT:  [place]/AT,  EACs-clear, TMs-wnl, NOSE-clear, THROAT-clear, no lesions, no postnasal drip or exudate noted.   NECK:  Supple w/ fair ROM; no JVD; normal carotid impulses w/o bruits; no thyromegaly or nodules palpated; no lymphadenopathy.    RESP  Clear  P & A; w/o, wheezes/  rales/ or rhonchi. no accessory muscle use, no dullness to percussion  CARD:  RRR, no m/r/g, no peripheral edema, pulses intact, no cyanosis or clubbing.  GI:   Soft & nt; nml bowel sounds; no organomegaly or masses detected.   Musco: Warm bil, no deformities or joint swelling noted.   Neuro: alert, no focal deficits noted.    Skin: Warm, no lesions or rashes    Lab Results:  CBC    Component Value Date/Time   WBC 7.4 07/25/2020 0326   RBC 4.93 07/25/2020 0326   HGB 13.5 07/25/2020 0326   HCT 38.3 (L) 07/25/2020 0326   PLT 276 07/25/2020 0326   MCV 77.7 (L) 07/25/2020 0326   MCH 27.4 07/25/2020 0326   MCHC 35.2 07/25/2020 0326   RDW 14.8 07/25/2020 0326   LYMPHSABS 2.5 07/23/2020 2046   MONOABS 0.7 07/23/2020 2046   EOSABS 0.2 07/23/2020 2046   BASOSABS 0.0 07/23/2020 2046    BMET    Component Value Date/Time   NA 128 (L) 07/25/2020 0326   K 3.2 (L) 07/25/2020 0326   CL 97 (L) 07/25/2020 0326   CO2 21 (L) 07/25/2020 0326   GLUCOSE 104 (H) 07/25/2020 0326   BUN 8 07/25/2020 0326   CREATININE 0.74 07/25/2020 0326   CALCIUM 8.4 (L) 07/25/2020 0326   GFRNONAA >60 07/25/2020 0326    BNP    Component Value Date/Time   BNP 7.9 07/23/2020 2047    ProBNP No results found for:  PROBNP  Imaging: DG Chest 2 View  Result Date: 07/25/2020 CLINICAL DATA:  Chest pain shortness of breath EXAM: CHEST - 2 VIEW COMPARISON:  Jul 23, 2020. FINDINGS: The heart size and mediastinal contours are within normal limits. Bibasilar hazy opacities. No visible pleural effusion or pneumothorax. The visualized skeletal structures are unchanged. IMPRESSION: Bibasilar hazy opacities, favor atelectasis although developing infiltrates not excluded. Electronically Signed   By: Maudry Mayhew MD   On: 07/25/2020 03:47   DG Chest 2 View  Result Date: 07/23/2020 CLINICAL DATA:  Shortness of breath EXAM: CHEST - 2 VIEW COMPARISON:  07/15/2020 FINDINGS: Cardiac shadow is stable. The lungs are well aerated bilaterally. No focal infiltrate or sizable effusion is seen. No bony abnormality is noted. IMPRESSION: No acute abnormality noted. Electronically Signed   By: Alcide Clever M.D.   On: 07/23/2020 21:32   DG Chest 2 View  Result Date: 07/13/2020 CLINICAL DATA:  Persistent cough and dyspnea for 10 days, on antibiotic therapy EXAM: CHEST - 2 VIEW COMPARISON:  07/05/2020 chest radiograph. FINDINGS: Stable cardiomediastinal silhouette with normal heart size. No pneumothorax. No pleural effusion. No pulmonary edema. Increased mild patchy opacity overlying the lower thoracic spine on the lateral view suggesting lower lobe pneumonia, probably right lower lobe. IMPRESSION: Increased mild patchy opacity overlying the lower thoracic spine on the lateral view, favor mild right lower lobe pneumonia. Chest radiograph follow-up advised. Electronically Signed   By: Delbert Phenix M.D.   On: 07/13/2020 12:05   DG Chest 2 View  Result Date: 07/05/2020 CLINICAL DATA:  55 year old male with cough. Questionable right lung base opacity on earlier portable chest. EXAM: CHEST - 2 VIEW COMPARISON:  Portable chest 0834 hours today. Chest radiographs 04/29/2020. FINDINGS: Stable lung volumes since February, improved on the lateral  today. Normal cardiac size and mediastinal contours. Visualized tracheal air column is within normal limits. Both lungs appear stable since February and clear. No pneumothorax or pleural effusion. Substantial lumbar scoliosis. Straightening of thoracic kyphosis. Negative  visible bowel gas pattern. IMPRESSION: 1.  No cardiopulmonary abnormality. 2. Scoliosis. Electronically Signed   By: Odessa Fleming M.D.   On: 07/05/2020 10:33   CT Angio Chest PE W/Cm &/Or Wo Cm  Result Date: 07/15/2020 CLINICAL DATA:  Shortness of breath EXAM: CT ANGIOGRAPHY CHEST WITH CONTRAST TECHNIQUE: Multidetector CT imaging of the chest was performed using the standard protocol during bolus administration of intravenous contrast. Multiplanar CT image reconstructions and MIPs were obtained to evaluate the vascular anatomy. CONTRAST:  OMNIPAQUE IOHEXOL 350 MG/ML SOLN COMPARISON:  None. FINDINGS: Cardiovascular: No filling defects in the pulmonary arteries to suggest pulmonary emboli. Heart is normal size. Aorta is normal caliber. Mediastinum/Nodes: No mediastinal, hilar, or axillary adenopathy. Trachea and esophagus are unremarkable. Thyroid unremarkable. Lungs/Pleura: Lungs are clear. No focal airspace opacities or suspicious nodules. No effusions. Upper Abdomen: Imaging into the upper abdomen demonstrates no acute findings. Musculoskeletal: Chest wall soft tissues are unremarkable. No acute bony abnormality. Review of the MIP images confirms the above findings. IMPRESSION: No evidence of pulmonary embolus. No acute cardiopulmonary disease. Electronically Signed   By: Charlett Nose M.D.   On: 07/15/2020 12:13   DG Chest Portable 1 View  Result Date: 07/15/2020 CLINICAL DATA:  Shortness of breath, cough. EXAM: PORTABLE CHEST 1 VIEW COMPARISON:  Chest radiograph dated 07/13/2020. FINDINGS: The heart size and mediastinal contours are within normal limits. There is suggestion of mild bibasilar atelectasis/airspace disease. No pleural  effusion or pneumothorax. The visualized skeletal structures are unremarkable. IMPRESSION: Suggestion of mild bibasilar atelectasis/airspace disease. Electronically Signed   By: Romona Curls M.D.   On: 07/15/2020 11:23   DG Chest Port 1 View  Result Date: 07/05/2020 CLINICAL DATA:  55 year old with a cough. EXAM: PORTABLE CHEST 1 VIEW COMPARISON:  04/29/2020 FINDINGS: AP portable views of the chest were obtained. Increased densities along the medial right lung base compared to the previous examination and not clear if this is related to patient positioning and projection. Upper lungs are clear. Overall heart size is stable. IMPRESSION: Study has limitations. There may be increased densities at the medial right lung base that are nonspecific. If possible, consider repeat imaging with PA and lateral views of the chest. Electronically Signed   By: Richarda Overlie M.D.   On: 07/05/2020 09:03   DG ESOPHAGUS W SINGLE CM (SOL OR THIN BA)  Result Date: 07/18/2020 CLINICAL DATA:  55 year old male complaining of difficulty swallowing for 2 weeks. EXAM: ESOPHOGRAM / BARIUM SWALLOW / BARIUM TABLET STUDY TECHNIQUE: Combined double contrast and single contrast examination performed using effervescent crystals, thick barium liquid, and thin barium liquid. The patient was observed with fluoroscopy swallowing a 13 mm barium sulphate tablet. FLUOROSCOPY TIME:  Fluoroscopy Time:  3 minutes 48 seconds Radiation Exposure Index (if provided by the fluoroscopic device): 122 mGy Number of Acquired Spot Images: 1689 COMPARISON:  None. FINDINGS: Patient was administered barium contrast orally during fluoroscopic evaluation. Thoracic esophagus appeared normal without mass, ulceration or other focal wall irregularity. Contrast moved promptly through the thoracic esophagus and into the stomach without evidence of obstruction or stricture. No hiatal hernia seen despite Valsalva maneuvers. No gastroesophageal reflux visualized with  provocative maneuvers. There was mild esophageal dysmotility. Limited imaging of the stomach and proximal small bowel demonstrates no gross abnormality. A 13 mm barium tablet was administered and passed without delay into the stomach. IMPRESSION: 1.  No evidence obstruction or stricture in the thoracic esophagus. 2.  No hiatal hernia. 3.  No gastroesophageal reflux. 4.  Mild esophageal dysmotility. Electronically Signed   By: Emmaline Kluver M.D.   On: 07/18/2020 16:56      No flowsheet data found.  No results found for: NITRICOXIDE      Assessment & Plan:   GERD (gastroesophageal reflux disease) Continue on GERD diet.  Patient has had several months of ongoing choking sensation.  Will refer to GI.  Irritable larynx syndrome Patient is continue on trigger prevention with treatment aimed at cough control GERD therapy and chronic rhinitis prevention and treatment  Plan  Patient Instructions  Continue on Claritin 10mg  daily Continue on Singulair 10mg  daily  Robitussin DM 5 ml  every 4hr as needed cough.  Tessalon Three times a day  As needed  Cough  GERD diet  Advance bland diet. Avoid rice or irritating foods.  Continue on Protonix 40mg  daily  Contine on Pepcid 20mg  At bedtime   Continue on daily  Stress reducers .  Voice rest .  Follow up with Primary MD as discussed  Follow up with Dr. or Rieley Hausman NP in 3 weeks with PFT and As needed   Please contact office for sooner follow up if symptoms do not improve or worsen or seek emergency care          Globus sensation ENT work-up during hospitalization was unrevealing.  Continue current therapy. Referral to GI.  RAD (reactive airway disease) Recurrent flare-continue with .  Cough control and GERD therapy.     , NP 07/28/2020

## 2020-07-28 NOTE — Patient Instructions (Addendum)
Continue on Claritin 10mg  daily Continue on Singulair 10mg  daily  Robitussin DM 5 ml  every 4hr as needed cough.  Tessalon Three times a day  As needed  Cough  GERD diet  Advance bland diet. Avoid rice or irritating foods.  Continue on Protonix 40mg  daily  Contine on Pepcid 20mg  At bedtime   Continue on daily  Stress reducers .  Voice rest .  Follow up with Primary MD as discussed  Follow up with Dr. or Tarance Balan NP in 3 weeks with PFT and As needed   Please contact office for sooner follow up if symptoms do not improve or worsen or seek emergency care

## 2020-07-28 NOTE — Assessment & Plan Note (Signed)
ENT work-up during hospitalization was unrevealing.  Continue current therapy. Referral to GI.

## 2020-07-28 NOTE — Assessment & Plan Note (Signed)
Patient is continue on trigger prevention with treatment aimed at cough control GERD therapy and chronic rhinitis prevention and treatment  Plan  Patient Instructions  Continue on Claritin 10mg  daily Continue on Singulair 10mg  daily  Robitussin DM 5 ml  every 4hr as needed cough.  Tessalon Three times a day  As needed  Cough  GERD diet  Advance bland diet. Avoid rice or irritating foods.  Continue on Protonix 40mg  daily  Contine on Pepcid 20mg  At bedtime   Continue on daily  Stress reducers .  Voice rest .  Follow up with Primary MD as discussed  Follow up with Dr. or Rickie Gange NP in 3 weeks with PFT and As needed   Please contact office for sooner follow up if symptoms do not improve or worsen or seek emergency care

## 2020-07-28 NOTE — Assessment & Plan Note (Signed)
Recurrent flare-continue with Mikael Spray.  Cough control and GERD therapy.

## 2020-07-28 NOTE — Assessment & Plan Note (Signed)
Continue on GERD diet.  Patient has had several months of ongoing choking sensation.  Will refer to GI.

## 2020-07-29 NOTE — Telephone Encounter (Signed)
Will forward to [facility] to document as pt was made an appt and seen by TP on 5/23 and referral has been placed by Heather to GI.

## 2020-08-01 NOTE — Telephone Encounter (Signed)
Late entry: Patient scheduled to see Alexander Oaks NP on 07/28/20 and referral made to GI.  Nothing further needed.

## 2020-08-04 ENCOUNTER — Telehealth: Payer: Self-pay | Admitting: *Deleted

## 2020-08-04 NOTE — Telephone Encounter (Signed)
Received a fax from the pts pharmacy stating that his insurance does not cover the benzonatate.  They did not give any alternatives.  Please advise. Thanks

## 2020-08-05 NOTE — Telephone Encounter (Signed)
I have called and LM on VM for the pt to be aware that his cough medication is not covered by his insurance.  Advised to call back for any questions or concerns.

## 2020-08-05 NOTE — Telephone Encounter (Signed)
Yes most Medicare plans do not cover cough medications.  There is not alternative except otc cough meds

## 2020-08-11 ENCOUNTER — Telehealth: Payer: Self-pay | Admitting: Adult Health

## 2020-08-11 MED ORDER — FAMOTIDINE 40 MG PO TABS: 40.0000 mg | ORAL_TABLET | Freq: Every day | ORAL | 3 refills | Status: DC

## 2020-08-11 MED ORDER — PANTOPRAZOLE SODIUM 40 MG PO TBEC: 40.0000 mg | DELAYED_RELEASE_TABLET | Freq: Every day | ORAL | 3 refills | Status: DC

## 2020-08-11 MED ORDER — MONTELUKAST SODIUM 10 MG PO TABS: 10.0000 mg | ORAL_TABLET | Freq: Every day | ORAL | 3 refills | Status: DC

## 2020-08-11 MED ORDER — LORATADINE 10 MG PO TABS: 10.0000 mg | ORAL_TABLET | Freq: Every day | ORAL | 3 refills | Status: DC

## 2020-08-11 MED ORDER — REVEFENACIN 175 MCG/3ML IN SOLN: 175.0000 ug | Freq: Every day | RESPIRATORY_TRACT | 3 refills | Status: DC

## 2020-08-11 NOTE — Telephone Encounter (Signed)
I have sent in refills for the pt and I have called and LM on VM for him to make him aware that refills have been sent to his pharmacy.

## 2020-08-11 NOTE — Telephone Encounter (Signed)
Glad he is feeling better.   Yes can refill those meds x 3 (currently rx at [facility])

## 2020-08-11 NOTE — Telephone Encounter (Signed)
Called and spoke with patient regarding medication he is requesting to be sent into the pharmacy. Patient states he would like to know if he can take these medications again? If there are any side effects of him taking medications prior to refilling them. He states he is feeling some better but not 100%. He is scheduled to see his PCP on Thursday. 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 He will discuss his situation with him at that visit but wanted to reach out to our office because we are aware of his current condition.  Take states he needs refill on:  Loratadine 10 mg  Yeperli nebulizer Famotidine 40 mg Montelukast 10 mg Pantoprazole 40 mg   Tammy please advise.  Thank you.

## 2020-08-14 DIAGNOSIS — R432 Parageusia: Secondary | ICD-10-CM | POA: Insufficient documentation

## 2020-08-14 DIAGNOSIS — R430 Anosmia: Secondary | ICD-10-CM | POA: Insufficient documentation

## 2020-08-14 DIAGNOSIS — J387 Other diseases of larynx: Secondary | ICD-10-CM | POA: Diagnosis not present

## 2020-08-14 DIAGNOSIS — J9601 Acute respiratory failure with hypoxia: Secondary | ICD-10-CM | POA: Diagnosis not present

## 2020-08-14 DIAGNOSIS — J4520 Mild intermittent asthma, uncomplicated: Secondary | ICD-10-CM | POA: Diagnosis not present

## 2020-08-14 DIAGNOSIS — L819 Disorder of pigmentation, unspecified: Secondary | ICD-10-CM | POA: Insufficient documentation

## 2020-08-14 HISTORY — DX: Parageusia: R43.2

## 2020-08-14 HISTORY — DX: Anosmia: R43.0

## 2020-08-14 HISTORY — DX: Disorder of pigmentation, unspecified: L81.9

## 2020-08-20 DIAGNOSIS — J9601 Acute respiratory failure with hypoxia: Secondary | ICD-10-CM | POA: Diagnosis not present

## 2020-08-20 DIAGNOSIS — R000 Tachycardia, unspecified: Secondary | ICD-10-CM | POA: Diagnosis not present

## 2020-08-20 DIAGNOSIS — R059 Cough, unspecified: Secondary | ICD-10-CM | POA: Diagnosis not present

## 2020-08-25 ENCOUNTER — Inpatient Hospital Stay (INDEPENDENT_AMBULATORY_CARE_PROVIDER_SITE_OTHER): Payer: Medicare HMO | Admitting: Primary Care

## 2020-08-25 ENCOUNTER — Other Ambulatory Visit (HOSPITAL_COMMUNITY): Payer: Medicare HMO

## 2020-08-25 DIAGNOSIS — R432 Parageusia: Secondary | ICD-10-CM | POA: Diagnosis not present

## 2020-08-25 DIAGNOSIS — R430 Anosmia: Secondary | ICD-10-CM | POA: Diagnosis not present

## 2020-08-25 DIAGNOSIS — R0981 Nasal congestion: Secondary | ICD-10-CM | POA: Diagnosis not present

## 2020-08-25 DIAGNOSIS — K219 Gastro-esophageal reflux disease without esophagitis: Secondary | ICD-10-CM | POA: Diagnosis not present

## 2020-08-25 DIAGNOSIS — J387 Other diseases of larynx: Secondary | ICD-10-CM | POA: Diagnosis not present

## 2020-08-25 DIAGNOSIS — R0989 Other specified symptoms and signs involving the circulatory and respiratory systems: Secondary | ICD-10-CM | POA: Diagnosis not present

## 2020-08-27 ENCOUNTER — Ambulatory Visit: Payer: Medicare HMO | Admitting: Pulmonary Disease

## 2020-09-03 DIAGNOSIS — Z6841 Body Mass Index (BMI) 40.0 and over, adult: Secondary | ICD-10-CM | POA: Diagnosis not present

## 2020-09-03 DIAGNOSIS — J4520 Mild intermittent asthma, uncomplicated: Secondary | ICD-10-CM | POA: Diagnosis not present

## 2020-09-03 DIAGNOSIS — J984 Other disorders of lung: Secondary | ICD-10-CM | POA: Diagnosis not present

## 2020-09-03 DIAGNOSIS — G894 Chronic pain syndrome: Secondary | ICD-10-CM | POA: Diagnosis not present

## 2020-09-03 DIAGNOSIS — J9601 Acute respiratory failure with hypoxia: Secondary | ICD-10-CM | POA: Diagnosis not present

## 2020-09-19 DIAGNOSIS — J9601 Acute respiratory failure with hypoxia: Secondary | ICD-10-CM | POA: Diagnosis not present

## 2020-09-19 DIAGNOSIS — R000 Tachycardia, unspecified: Secondary | ICD-10-CM | POA: Diagnosis not present

## 2020-09-19 DIAGNOSIS — R059 Cough, unspecified: Secondary | ICD-10-CM | POA: Diagnosis not present

## 2020-09-24 ENCOUNTER — Ambulatory Visit (INDEPENDENT_AMBULATORY_CARE_PROVIDER_SITE_OTHER): Payer: Medicare HMO | Admitting: Gastroenterology

## 2020-09-24 ENCOUNTER — Encounter: Payer: Self-pay | Admitting: Gastroenterology

## 2020-09-24 VITALS — BP 126/70 | HR 91 | Ht 69.0 in | Wt 321.8 lb

## 2020-09-24 DIAGNOSIS — Z6841 Body Mass Index (BMI) 40.0 and over, adult: Secondary | ICD-10-CM

## 2020-09-24 DIAGNOSIS — R198 Other specified symptoms and signs involving the digestive system and abdomen: Secondary | ICD-10-CM | POA: Diagnosis not present

## 2020-09-24 DIAGNOSIS — R0989 Other specified symptoms and signs involving the circulatory and respiratory systems: Secondary | ICD-10-CM

## 2020-09-24 NOTE — H&P (View-Only) (Signed)
HPI : [name] is a 55 year old male referred by Dr. Truett Perna, MD for further evaluation of persistent globus sensation.  The patient states he had no problems with globus sensation or any GERD symptoms until he woke up on May 10th feeling like he was choking and was having significant difficulty breathing.  He ended up requiring hospitalization for 4 days because of respiratory failure but did not require intubation.  He was treated with steroids, nebulizers and acid suppression.  Ever since then, he feels like there is something stuck in his throat and "squeezing" his airway.  He is bothered by constant mucus and phlegm production.  The patient admits that in the past he had issues with mucus in his throat, but this was only when he woke up in the morning and when he had a bowel movement.  Currently, the patient states that mucus is constantly being brought up into his throat all day every day.  He was evaluated by ENT while hospitalized, and a nasopharyngoscopy was unremarkable.  A CT of the chest was also unremarkable.  A barium swallow was essentially normal as well, demonstrating no reflux, stricture or significant motility abnormalities. The patient denies typical symptoms of GERD, to include heartburn or acid regurgitation.  He also denies any difficulties with swallowing or feeling the need to wash food down.  He does complain of lots of sneezing, and decreased senses of taste and smell.  He is prescribed Claritin and Singulair currently.  He is still taking pantoprazole as well.  None of these therapies have improved his globus sensation or phlegm production. His weight has fluctuated somewhat but has mostly been increasing over the past few years.  He currently weighs 321 pounds.  At hospital discharge in May he weighed 314.  The heaviest he has been is 350 pounds.  He is distressed about his weight and is seeking help, particularly asking about weight loss medications.  He states he is unable  to do any exercise because of his numerous lower extremity surgeries and subsequent leg dysfunction.   Past Medical History:  Diagnosis Date   GERD (gastroesophageal reflux disease)      Past Surgical History:  Procedure Laterality Date   LEG SURGERY     History reviewed. No pertinent family history. Social History   Tobacco Use   Smoking status: Some Days   Smokeless tobacco: Never   Tobacco comments:    pt reports smoking marijuana occaisionally in [place] for orthopedic pain  Vaping Use   Vaping Use: Never used  Substance Use Topics   Alcohol use: Not Currently   Drug use: Not Currently   Current Outpatient Medications  Medication Sig Dispense Refill   albuterol (PROVENTIL) (2.5 MG/3ML) 0.083% nebulizer solution Take 3 mLs (2.5 mg total) by nebulization every 6 (six) hours as needed for wheezing or shortness of breath. 75 mL 1   ALPRAZolam (XANAX) 0.5 MG tablet Take 1 tablet (0.5 mg total) by mouth 2 (two) times daily as needed for anxiety. 10 tablet 0   Cholecalciferol (VITAMIN D) 50 MCG (2000 UT) tablet Take 4,000 Units by mouth daily.     famotidine (PEPCID) 40 MG tablet Take 1 tablet (40 mg total) by mouth at bedtime. 30 tablet 3   loratadine (CLARITIN) 10 MG tablet Take 1 tablet (10 mg total) by mouth daily. 30 tablet 3   montelukast (SINGULAIR) 10 MG tablet Take 1 tablet (10 mg total) by mouth at bedtime. 30 tablet 3  pantoprazole (PROTONIX) 40 MG tablet Take 1 tablet (40 mg total) by mouth daily at 12 noon. 30 tablet 3   Zinc Sulfate (ZINC 15 PO) Take by mouth.     No current facility-administered medications for this visit.   No Known Allergies   Review of Systems: All systems reviewed and negative except where noted in HPI.    No results found.  Physical Exam: BP 126/70   Pulse 91   Ht 5\' 9"  (1.753 m)   Wt (!) 321 lb 12.8 oz (146 kg)   SpO2 96%   BMI 47.52 kg/m  Constitutional: Pleasant,well-developed, obese male in no acute distress, seated in  wheelchair. HEENT: Normocephalic and atraumatic. Conjunctivae are normal. No scleral icterus. Neck supple.  Extremities: no edema Neurological: Alert and oriented to person place and time. Skin: Skin is warm and dry. No rashes noted. Psychiatric: Normal mood and affect. Behavior is normal.  CBC    Component Value Date/Time   WBC 7.4 07/25/2020 0326   RBC 4.93 07/25/2020 0326   HGB 13.5 07/25/2020 0326   HCT 38.3 (L) 07/25/2020 0326   PLT 276 07/25/2020 0326   MCV 77.7 (L) 07/25/2020 0326   MCH 27.4 07/25/2020 0326   MCHC 35.2 07/25/2020 0326   RDW 14.8 07/25/2020 0326   LYMPHSABS 2.5 07/23/2020 2046   MONOABS 0.7 07/23/2020 2046   EOSABS 0.2 07/23/2020 2046   BASOSABS 0.0 07/23/2020 2046    CMP     Component Value Date/Time   NA 128 (L) 07/25/2020 0326   K 3.2 (L) 07/25/2020 0326   CL 97 (L) 07/25/2020 0326   CO2 21 (L) 07/25/2020 0326   GLUCOSE 104 (H) 07/25/2020 0326   BUN 8 07/25/2020 0326   CREATININE 0.74 07/25/2020 0326   CALCIUM 8.4 (L) 07/25/2020 0326   PROT 6.6 07/16/2020 0537   ALBUMIN 3.5 07/16/2020 0537   AST 18 07/16/2020 0537   ALT 23 07/16/2020 0537   ALKPHOS 64 07/16/2020 0537   BILITOT 0.5 07/16/2020 0537   GFRNONAA >60 07/25/2020 0326     ASSESSMENT AND PLAN: 55 year old obese male with unusual sudden onset of globus sensation over 2 months ago that has been persistent and unrelenting with associated constant phlegm and mucus production, without any typical GERD symptoms and without any response to acid suppression.  He also had a normal barium swallow.  I think GERD is an unlikely cause of these symptoms given the constant nature and the lack of response to acid suppression.  He does report other symptoms of allergic rhinitis including decreased sense of taste and smell.  He has not been tried on intranasal corticosteroids for empiric postnasal drip therapy.  I recommended we perform an upper endoscopy to assess his anatomy and to assess for any  objective evidence of reflux on exam, and also recommended he start taking Flonase once a day to see if this improves his symptoms.  If his upper endoscopy is normal, we can consider performing impedance testing.  We also discussed how globus sensation can be a very vexing symptom in which no underlying etiology is found. With regards to the patient's obesity, I told him that I typically do not specifically treat obesity other than diet and weight loss counseling, and that I have no experience with these weight loss medications.  I told him he may be better served by his primary doctor or being referred to a more dedicated weight loss program, but that would help in whatever ways I could  help him achieve his weight loss. The patient is also never had a colonoscopy or any colon cancer screening.  He is very hesitant about a colonoscopy and was inquiring about stool based methods. We decided to focus on his current symptoms at the time, and we would readdress colon cancer screening at another visit.  Globus sensation - Upper endoscopy, consider impedance testing if normal - Trial of intranasal corticosteroids (Flonase once daily)  The details, risks (including bleeding, perforation, infection, missed lesions, medication reactions and possible hospitalization or surgery if complications occur), benefits, and alternatives to EGD with possible biopsy and possible polypectomy were discussed with the patient and he consents to proceed.    I spent a total of 50 minutes reviewing the patient's medical record, interviewing and examining the patient, discussing her diagnosis and management of her condition going forward, and documenting in the medical record   Alexander Black E. Tomasa Rand, MD [facility]   Alexander Perna, MD

## 2020-09-24 NOTE — Progress Notes (Signed)
HPI : [name] is a 55 year old male referred by Dr. Truett Perna, MD for further evaluation of persistent globus sensation.  The patient states he had no problems with globus sensation or any GERD symptoms until he woke up on May 10th feeling like he was choking and was having significant difficulty breathing.  He ended up requiring hospitalization for 4 days because of respiratory failure but did not require intubation.  He was treated with steroids, nebulizers and acid suppression.  Ever since then, he feels like there is something stuck in his throat and "squeezing" his airway.  He is bothered by constant mucus and phlegm production.  The patient admits that in the past he had issues with mucus in his throat, but this was only when he woke up in the morning and when he had a bowel movement.  Currently, the patient states that mucus is constantly being brought up into his throat all day every day.  He was evaluated by ENT while hospitalized, and a nasopharyngoscopy was unremarkable.  A CT of the chest was also unremarkable.  A barium swallow was essentially normal as well, demonstrating no reflux, stricture or significant motility abnormalities. The patient denies typical symptoms of GERD, to include heartburn or acid regurgitation.  He also denies any difficulties with swallowing or feeling the need to wash food down.  He does complain of lots of sneezing, and decreased senses of taste and smell.  He is prescribed Claritin and Singulair currently.  He is still taking pantoprazole as well.  None of these therapies have improved his globus sensation or phlegm production. His weight has fluctuated somewhat but has mostly been increasing over the past few years.  He currently weighs 321 pounds.  At hospital discharge in May he weighed 314.  The heaviest he has been is 350 pounds.  He is distressed about his weight and is seeking help, particularly asking about weight loss medications.  He states he is unable  to do any exercise because of his numerous lower extremity surgeries and subsequent leg dysfunction.   Past Medical History:  Diagnosis Date   GERD (gastroesophageal reflux disease)      Past Surgical History:  Procedure Laterality Date   LEG SURGERY     History reviewed. No pertinent family history. Social History   Tobacco Use   Smoking status: Some Days   Smokeless tobacco: Never   Tobacco comments:    pt reports smoking marijuana occaisionally in [place] for orthopedic pain  Vaping Use   Vaping Use: Never used  Substance Use Topics   Alcohol use: Not Currently   Drug use: Not Currently   Current Outpatient Medications  Medication Sig Dispense Refill   albuterol (PROVENTIL) (2.5 MG/3ML) 0.083% nebulizer solution Take 3 mLs (2.5 mg total) by nebulization every 6 (six) hours as needed for wheezing or shortness of breath. 75 mL 1   ALPRAZolam (XANAX) 0.5 MG tablet Take 1 tablet (0.5 mg total) by mouth 2 (two) times daily as needed for anxiety. 10 tablet 0   Cholecalciferol (VITAMIN D) 50 MCG (2000 UT) tablet Take 4,000 Units by mouth daily.     famotidine (PEPCID) 40 MG tablet Take 1 tablet (40 mg total) by mouth at bedtime. 30 tablet 3   loratadine (CLARITIN) 10 MG tablet Take 1 tablet (10 mg total) by mouth daily. 30 tablet 3   montelukast (SINGULAIR) 10 MG tablet Take 1 tablet (10 mg total) by mouth at bedtime. 30 tablet 3  pantoprazole (PROTONIX) 40 MG tablet Take 1 tablet (40 mg total) by mouth daily at 12 noon. 30 tablet 3   Zinc Sulfate (ZINC 15 PO) Take by mouth.     No current facility-administered medications for this visit.   No Known Allergies   Review of Systems: All systems reviewed and negative except where noted in HPI.    No results found.  Physical Exam: BP 126/70   Pulse 91   Ht 5\' 9"  (1.753 m)   Wt (!) 321 lb 12.8 oz (146 kg)   SpO2 96%   BMI 47.52 kg/m  Constitutional: Pleasant,well-developed, obese male in no acute distress, seated in  wheelchair. HEENT: Normocephalic and atraumatic. Conjunctivae are normal. No scleral icterus. Neck supple.  Extremities: no edema Neurological: Alert and oriented to person place and time. Skin: Skin is warm and dry. No rashes noted. Psychiatric: Normal mood and affect. Behavior is normal.  CBC    Component Value Date/Time   WBC 7.4 07/25/2020 0326   RBC 4.93 07/25/2020 0326   HGB 13.5 07/25/2020 0326   HCT 38.3 (L) 07/25/2020 0326   PLT 276 07/25/2020 0326   MCV 77.7 (L) 07/25/2020 0326   MCH 27.4 07/25/2020 0326   MCHC 35.2 07/25/2020 0326   RDW 14.8 07/25/2020 0326   LYMPHSABS 2.5 07/23/2020 2046   MONOABS 0.7 07/23/2020 2046   EOSABS 0.2 07/23/2020 2046   BASOSABS 0.0 07/23/2020 2046    CMP     Component Value Date/Time   NA 128 (L) 07/25/2020 0326   K 3.2 (L) 07/25/2020 0326   CL 97 (L) 07/25/2020 0326   CO2 21 (L) 07/25/2020 0326   GLUCOSE 104 (H) 07/25/2020 0326   BUN 8 07/25/2020 0326   CREATININE 0.74 07/25/2020 0326   CALCIUM 8.4 (L) 07/25/2020 0326   PROT 6.6 07/16/2020 0537   ALBUMIN 3.5 07/16/2020 0537   AST 18 07/16/2020 0537   ALT 23 07/16/2020 0537   ALKPHOS 64 07/16/2020 0537   BILITOT 0.5 07/16/2020 0537   GFRNONAA >60 07/25/2020 0326     ASSESSMENT AND PLAN: 55 year old obese male with unusual sudden onset of globus sensation over 2 months ago that has been persistent and unrelenting with associated constant phlegm and mucus production, without any typical GERD symptoms and without any response to acid suppression.  He also had a normal barium swallow.  I think GERD is an unlikely cause of these symptoms given the constant nature and the lack of response to acid suppression.  He does report other symptoms of allergic rhinitis including decreased sense of taste and smell.  He has not been tried on intranasal corticosteroids for empiric postnasal drip therapy.  I recommended we perform an upper endoscopy to assess his anatomy and to assess for any  objective evidence of reflux on exam, and also recommended he start taking Flonase once a day to see if this improves his symptoms.  If his upper endoscopy is normal, we can consider performing impedance testing.  We also discussed how globus sensation can be a very vexing symptom in which no underlying etiology is found. With regards to the patient's obesity, I told him that I typically do not specifically treat obesity other than diet and weight loss counseling, and that I have no experience with these weight loss medications.  I told him he may be better served by his primary doctor or being referred to a more dedicated weight loss program, but that would help in whatever ways I could  help him achieve his weight loss. The patient is also never had a colonoscopy or any colon cancer screening.  He is very hesitant about a colonoscopy and was inquiring about stool based methods. We decided to focus on his current symptoms at the time, and we would readdress colon cancer screening at another visit.  Globus sensation - Upper endoscopy, consider impedance testing if normal - Trial of intranasal corticosteroids (Flonase once daily)  The details, risks (including bleeding, perforation, infection, missed lesions, medication reactions and possible hospitalization or surgery if complications occur), benefits, and alternatives to EGD with possible biopsy and possible polypectomy were discussed with the patient and he consents to proceed.    I spent a total of 50 minutes reviewing the patient's medical record, interviewing and examining the patient, discussing her diagnosis and management of her condition going forward, and documenting in the medical record   Alexander Sandell E. Tomasa Rand, MD [facility]   Alexander Perna, MD

## 2020-09-24 NOTE — Patient Instructions (Signed)
If you are age 53 or older, your body mass index should be between 23-30. Your Body mass index is 47.52 kg/m. If this is out of the aforementioned range listed, please consider follow up with your Primary Care Provider.  If you are age 71 or younger, your body mass index should be between 19-25. Your Body mass index is 47.52 kg/m. If this is out of the aformentioned range listed, please consider follow up with your Primary Care Provider.   You have been scheduled for an endoscopy. Please follow written instructions given to you at your visit today. If you use inhalers (even only as needed), please bring them with you on the day of your procedure.    The [facility] providers would like to encourage you to use [facility] to communicate with providers for non-urgent requests or questions.  Due to long hold times on the telephone, sending your provider a message by [facility] may be a faster and more efficient way to get a response.  Please allow 48 business hours for a response.  Please remember that this is for non-urgent requests.   It was a pleasure to see you today!  Thank you for trusting me with your gastrointestinal care!     Scott E. Tomasa Rand, MD

## 2020-10-07 ENCOUNTER — Encounter (HOSPITAL_COMMUNITY): Payer: Self-pay | Admitting: Gastroenterology

## 2020-10-07 ENCOUNTER — Other Ambulatory Visit: Payer: Self-pay

## 2020-10-07 NOTE — Progress Notes (Signed)
Phoned patient preop Endoscopy.  Patient stated that he did not have transportation after procedure because he is new to the area.  He was advised that he could not drive himself home after receiving anesthesia.  He was advised to call his physician's office to discuss.

## 2020-10-13 ENCOUNTER — Telehealth: Payer: Self-pay | Admitting: Gastroenterology

## 2020-10-13 NOTE — Telephone Encounter (Signed)
Patient advised that the endo department will contact him with medication instructions. He states they did and he wanted to make sure that our office was in agreement.  He is advised to follow the instructions of the endo staff at [facility] that contacted him this am.  He verbalized understanding.

## 2020-10-14 ENCOUNTER — Encounter (HOSPITAL_COMMUNITY): Payer: Self-pay | Admitting: Gastroenterology

## 2020-10-14 ENCOUNTER — Ambulatory Visit (HOSPITAL_COMMUNITY): Payer: Medicare HMO | Admitting: Anesthesiology

## 2020-10-14 ENCOUNTER — Encounter (HOSPITAL_COMMUNITY)
Admission: RE | Disposition: A | Discharge status: Home / Self Care | Payer: Self-pay | Source: Home / Self Care | Attending: Gastroenterology

## 2020-10-14 ENCOUNTER — Other Ambulatory Visit: Payer: Self-pay

## 2020-10-14 ENCOUNTER — Ambulatory Visit (HOSPITAL_COMMUNITY)
Admission: RE | Admit: 2020-10-14 | Discharge: 2020-10-14 | Disposition: A | Discharge status: Home / Self Care | Payer: Medicare HMO | Attending: Gastroenterology | Admitting: Gastroenterology

## 2020-10-14 DIAGNOSIS — J9601 Acute respiratory failure with hypoxia: Secondary | ICD-10-CM | POA: Diagnosis not present

## 2020-10-14 DIAGNOSIS — K219 Gastro-esophageal reflux disease without esophagitis: Secondary | ICD-10-CM | POA: Diagnosis not present

## 2020-10-14 DIAGNOSIS — F458 Other somatoform disorders: Secondary | ICD-10-CM | POA: Diagnosis not present

## 2020-10-14 DIAGNOSIS — R198 Other specified symptoms and signs involving the digestive system and abdomen: Secondary | ICD-10-CM

## 2020-10-14 DIAGNOSIS — Z6841 Body Mass Index (BMI) 40.0 and over, adult: Secondary | ICD-10-CM | POA: Insufficient documentation

## 2020-10-14 DIAGNOSIS — E669 Obesity, unspecified: Secondary | ICD-10-CM | POA: Diagnosis not present

## 2020-10-14 DIAGNOSIS — Z79899 Other long term (current) drug therapy: Secondary | ICD-10-CM | POA: Diagnosis not present

## 2020-10-14 DIAGNOSIS — R0989 Other specified symptoms and signs involving the circulatory and respiratory systems: Secondary | ICD-10-CM

## 2020-10-14 DIAGNOSIS — F17200 Nicotine dependence, unspecified, uncomplicated: Secondary | ICD-10-CM | POA: Insufficient documentation

## 2020-10-14 HISTORY — PX: ESOPHAGOGASTRODUODENOSCOPY (EGD) WITH PROPOFOL: SHX5813

## 2020-10-14 SURGERY — ESOPHAGOGASTRODUODENOSCOPY (EGD) WITH PROPOFOL
Anesthesia: Monitor Anesthesia Care

## 2020-10-14 MED ORDER — LACTATED RINGERS IV SOLN
INTRAVENOUS | Status: AC | PRN
  Administered 2020-10-14: 1000 mL via INTRAVENOUS

## 2020-10-14 MED ORDER — PANTOPRAZOLE SODIUM 40 MG PO TBEC: 40.0000 mg | DELAYED_RELEASE_TABLET | Freq: Two times a day (BID) | ORAL | 0 refills | Status: DC

## 2020-10-14 MED ORDER — SODIUM CHLORIDE 0.9 % IV SOLN: INTRAVENOUS | Status: DC

## 2020-10-14 MED ORDER — PROPOFOL 10 MG/ML IV BOLUS
INTRAVENOUS | Status: DC | PRN
  Administered 2020-10-14: 40 mg via INTRAVENOUS

## 2020-10-14 MED ORDER — LIDOCAINE 2% (20 MG/ML) 5 ML SYRINGE
INTRAMUSCULAR | Status: DC | PRN
  Administered 2020-10-14: 100 mg via INTRAVENOUS

## 2020-10-14 MED ORDER — LACTATED RINGERS IV SOLN: INTRAVENOUS | Status: DC

## 2020-10-14 MED ORDER — PROPOFOL 500 MG/50ML IV EMUL
INTRAVENOUS | Status: DC | PRN
  Administered 2020-10-14: 125 ug/kg/min via INTRAVENOUS

## 2020-10-14 NOTE — Discharge Instructions (Signed)
YOU HAD AN ENDOSCOPIC PROCEDURE TODAY: Refer to the procedure report and other information in the discharge instructions given to you for any specific questions about what was found during the examination. If this information does not answer your questions, please call [place] office at [phone] to clarify.   YOU SHOULD EXPECT: Some feelings of bloating in the abdomen. Passage of more gas than usual. Walking can help get rid of the air that was put into your GI tract during the procedure and reduce the bloating. If you had a lower endoscopy (such as a colonoscopy or flexible sigmoidoscopy) you may notice spotting of blood in your stool or on the toilet paper. Some abdominal soreness may be present for a day or two, also.  DIET: Your first meal following the procedure should be a light meal and then it is ok to progress to your normal diet. A half-sandwich or bowl of soup is an example of a good first meal. Heavy or fried foods are harder to digest and may make you feel nauseous or bloated. Drink plenty of fluids but you should avoid alcoholic beverages for 24 hours. If you had a esophageal dilation, please see attached instructions for diet.    ACTIVITY: Your care partner should take you home directly after the procedure. You should plan to take it easy, moving slowly for the rest of the day. You can resume normal activity the day after the procedure however YOU SHOULD NOT DRIVE, use power tools, machinery or perform tasks that involve climbing or major physical exertion for 24 hours (because of the sedation medicines used during the test).   SYMPTOMS TO REPORT IMMEDIATELY: A gastroenterologist can be reached at any hour. Please call [phone]  for any of the following symptoms:   Following upper endoscopy (EGD, EUS, ERCP, esophageal dilation) Vomiting of blood or coffee ground material  New, significant abdominal pain  New, significant chest pain or pain under the shoulder blades  Painful or  persistently difficult swallowing  New shortness of breath  Black, tarry-looking or red, bloody stools  FOLLOW UP:  If any biopsies were taken you will be contacted by phone or by letter within the next 1-3 weeks. Call [phone]  if you have not heard about the biopsies in 3 weeks.  Please also call with any specific questions about appointments or follow up tests.  

## 2020-10-14 NOTE — Transfer of Care (Signed)
Immediate Anesthesia Transfer of Care Note  Patient: [name]  Procedure(s) Performed: ESOPHAGOGASTRODUODENOSCOPY (EGD) WITH PROPOFOL  Patient Location: Endoscopy Unit  Anesthesia Type:MAC  Level of Consciousness: awake, alert  and oriented  Airway & Oxygen Therapy: Patient Spontanous Breathing and Patient connected to face mask oxygen  Post-op Assessment: Report given to RN and Post -op Vital signs reviewed and stable  Post vital signs: Reviewed and stable  Last Vitals:  Vitals Value Taken Time  BP 138/110 10/14/20 0950  Temp    Pulse 85 10/14/20 0951  Resp 16 10/14/20 0951  SpO2 96 % 10/14/20 0951  Vitals shown include unvalidated device data.  Last Pain:  Vitals:   10/14/20 0857  TempSrc: Oral  PainSc: 0-No pain         Complications: No notable events documented.

## 2020-10-14 NOTE — Interval H&P Note (Signed)
History and Physical Interval Note:  10/14/2020 9:28 AM  Alexander Black  has presented today for surgery, with the diagnosis of Globus Sensation.  The various methods of treatment have been discussed with the patient and family. After consideration of risks, benefits and other options for treatment, the patient has consented to  Procedure(s): ESOPHAGOGASTRODUODENOSCOPY (EGD) WITH PROPOFOL (N/A) as a surgical intervention.  The patient's history has been reviewed, patient examined, no change in status, stable for surgery.  I have reviewed the patient's chart and labs.  Questions were answered to the patient's satisfaction.     Jenel Lucks

## 2020-10-14 NOTE — Anesthesia Preprocedure Evaluation (Addendum)
Anesthesia Evaluation  Patient identified by MRN, date of birth, ID band Patient awake    Reviewed: Allergy & Precautions, NPO status , Patient's Chart, lab work & pertinent test results  History of Anesthesia Complications Negative for: history of anesthetic complications  Airway Mallampati: III  TM Distance: >3 FB Neck ROM: Full    Dental  (+) Dental Advisory Given, Loose,    Pulmonary Current Smoker and Patient abstained from smoking.,   Reactive airway    Pulmonary exam normal        Cardiovascular negative cardio ROS Normal cardiovascular exam     Neuro/Psych negative neurological ROS  negative psych ROS   GI/Hepatic Neg liver ROS, GERD  Medicated and Controlled,  Endo/Other  Morbid obesity  Renal/GU negative Renal ROS     Musculoskeletal negative musculoskeletal ROS (+)   Abdominal   Peds  Hematology negative hematology ROS (+)   Anesthesia Other Findings   Reproductive/Obstetrics                            Anesthesia Physical Anesthesia Plan  ASA: 3  Anesthesia Plan: MAC   Post-op Pain Management:    Induction:   PONV Risk Score and Plan: 1 and Propofol infusion and Treatment may vary due to age or medical condition  Airway Management Planned: Nasal Cannula and Natural Airway  Additional Equipment: None  Intra-op Plan:   Post-operative Plan:   Informed Consent: I have reviewed the patients History and Physical, chart, labs and discussed the procedure including the risks, benefits and alternatives for the proposed anesthesia with the patient or authorized representative who has indicated his/her understanding and acceptance.       Plan Discussed with: CRNA and Anesthesiologist  Anesthesia Plan Comments:        Anesthesia Quick Evaluation

## 2020-10-14 NOTE — Op Note (Signed)
[facility] Patient Name: Alexander Black Procedure Date: 10/14/2020 MRN: [id number] Attending MD: Dub Amis. Tomasa Rand , MD Date of Birth: 1965-11-14 CSN: [id number] Age: 55 Admit Type: Outpatient Procedure:                Upper GI endoscopy Indications:              Globus sensation Providers:                Lorin Picket E. Tomasa Rand, MD, Fransisca Connors, Rozetta Nunnery, Technician Referring MD:              Medicines:                Monitored Anesthesia Care Complications:            No immediate complications. Estimated Blood Loss:     Estimated blood loss: none. Procedure:                Pre-Anesthesia Assessment:                           - Prior to the procedure, a History and Physical                            was performed, and patient medications and                            allergies were reviewed. The patient's tolerance of                            previous anesthesia was also reviewed. The risks                            and benefits of the procedure and the sedation                            options and risks were discussed with the patient.                            All questions were answered, and informed consent                            was obtained. Prior Anticoagulants: The patient has                            taken no previous anticoagulant or antiplatelet                            agents. ASA Grade Assessment: III - A patient with                            severe systemic disease. After reviewing the risks  and benefits, the patient was deemed in                            satisfactory condition to undergo the procedure.                           After obtaining informed consent, the endoscope was                            passed under direct vision. Throughout the                            procedure, the patient's blood pressure, pulse, and                            oxygen saturations  were monitored continuously. The                            [id number]-[id number] ([id number] Olympus endoscope was introduced                            through the mouth, and advanced to the third part                            of duodenum. The upper GI endoscopy was                            accomplished without difficulty. The patient                            tolerated the procedure well. Scope In: Scope Out: Findings:      The examined portions of the nasopharynx, oropharynx and larynx were       normal.      The examined esophagus was normal. No evidence of reflux esophagitis       present.      A large amount of food (residue) was found in the gastric body and in       the gastric antrum. The gastric mucosa in the antrum appeared inflamed,       but the patient began coughing during the procedure, and due to risk of       aspiration, the decision was made to forego biopsies and terminate the       procedure.      Food (residue) was found in the duodenal bulb and in the second portion       of the duodenum. Impression:               - The examined portions of the nasopharynx,                            oropharynx and larynx were normal.                           - Normal esophagus.                           -  A large amount of food (residue) in the stomach,                            suggestive of gastroparesis.                           - Retained food in the duodenum.                           - No specimens collected.                           - No obvious abnormalities to explain symptoms, but                            gastroparesis could potentially be contributing Moderate Sedation:      Not Applicable - Patient had care per Anesthesia. Recommendation:           - Patient has a contact number available for                            emergencies. The signs and symptoms of potential                            delayed complications were discussed with the                             patient. Return to normal activities tomorrow.                            Written discharge instructions were provided to the                            patient.                           - Resume previous diet.                           - Continue present medications.                           - Do a gastric emptying study at appointment to be                            scheduled. 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 All rights reserved. The codes documented in this report are preliminary and upon coder review may  be revised to meet current compliance requirements. Britlee Skolnik E. Tomasa Rand, MD 10/14/2020 9:56:04 AM This report has been signed electronically. Number of Addenda: 0

## 2020-10-15 ENCOUNTER — Encounter (HOSPITAL_COMMUNITY): Payer: Self-pay | Admitting: Gastroenterology

## 2020-10-15 NOTE — Anesthesia Postprocedure Evaluation (Signed)
Anesthesia Post Note  Patient: [name]  Procedure(s) Performed: ESOPHAGOGASTRODUODENOSCOPY (EGD) WITH PROPOFOL     Patient location during evaluation: PACU Anesthesia Type: MAC Level of consciousness: awake and alert Pain management: pain level controlled Vital Signs Assessment: post-procedure vital signs reviewed and stable Respiratory status: spontaneous breathing, nonlabored ventilation and respiratory function stable Cardiovascular status: stable and blood pressure returned to baseline Anesthetic complications: no   No notable events documented.  Last Vitals:  Vitals:   10/14/20 0953 10/14/20 1010  BP: 119/65 (!) 146/96  Pulse: 84 64  Resp: 17 15  Temp:    SpO2: 94% 98%    Last Pain:  Vitals:   10/14/20 1010  TempSrc:   PainSc: 0-No pain                 Alexander Black

## 2020-10-16 ENCOUNTER — Telehealth: Payer: Self-pay

## 2020-10-16 DIAGNOSIS — R198 Other specified symptoms and signs involving the digestive system and abdomen: Secondary | ICD-10-CM

## 2020-10-16 DIAGNOSIS — K3189 Other diseases of stomach and duodenum: Secondary | ICD-10-CM

## 2020-10-16 DIAGNOSIS — R0989 Other specified symptoms and signs involving the circulatory and respiratory systems: Secondary | ICD-10-CM

## 2020-10-16 NOTE — Telephone Encounter (Signed)
Patient notified  He is aware he will be contacted directly with an appointment date and time for GES. He understands to remain on his pantoprazole

## 2020-10-16 NOTE — Telephone Encounter (Signed)
     Message from Tiajuana Amass, MD Lavonna Rua, can you advise [name] to stay on his PPI for now given the concern for gastroparesis.  And please assist him with getting his gastric emptying study ordered and scheduled.  Thank you!  Left message for patient to call back

## 2020-10-20 DIAGNOSIS — J9601 Acute respiratory failure with hypoxia: Secondary | ICD-10-CM | POA: Diagnosis not present

## 2020-10-20 DIAGNOSIS — R059 Cough, unspecified: Secondary | ICD-10-CM | POA: Diagnosis not present

## 2020-10-20 DIAGNOSIS — R000 Tachycardia, unspecified: Secondary | ICD-10-CM | POA: Diagnosis not present

## 2020-10-24 ENCOUNTER — Ambulatory Visit (HOSPITAL_COMMUNITY)
Admission: RE | Admit: 2020-10-24 | Discharge: 2020-10-24 | Disposition: A | Discharge status: Home / Self Care | Payer: Medicare HMO | Source: Ambulatory Visit | Attending: Gastroenterology | Admitting: Gastroenterology

## 2020-10-24 ENCOUNTER — Other Ambulatory Visit: Payer: Self-pay

## 2020-10-24 DIAGNOSIS — R198 Other specified symptoms and signs involving the digestive system and abdomen: Secondary | ICD-10-CM | POA: Insufficient documentation

## 2020-10-24 DIAGNOSIS — K3189 Other diseases of stomach and duodenum: Secondary | ICD-10-CM | POA: Insufficient documentation

## 2020-10-24 DIAGNOSIS — R0989 Other specified symptoms and signs involving the circulatory and respiratory systems: Secondary | ICD-10-CM

## 2020-10-24 MED ORDER — TECHNETIUM TC 99M SULFUR COLLOID
2.0000 | Freq: Once | INTRAVENOUS | Status: AC | PRN
  Administered 2020-10-24: 2 via INTRAVENOUS

## 2020-10-30 ENCOUNTER — Telehealth: Payer: Self-pay

## 2020-10-30 ENCOUNTER — Other Ambulatory Visit: Payer: Self-pay | Admitting: Adult Health

## 2020-10-30 NOTE — Progress Notes (Signed)
Alexander Black,  Your gastric emptying study showed rapid emptying (98% empty at 76 minutes).  This strongly suggests against gastroparesis.  This would not explain the large amount of food seen in your stomach during your endoscopy.  Are you positive you did not eat for 8 hours before your EGD?  If you are still having significant symptoms of globus sensation and phlegm, I recommend a pH/impedance study OFF PPI.  This study will give an objective assessment of how much gastroesophageal reflux you are having and if reflux events correlate with your symptoms.   Sheri, can you please assist Alexander Black with getting set up for a pH/impedance test?

## 2020-10-30 NOTE — Telephone Encounter (Signed)
Left message for patient to please call back. Trying to give Gastric Emptying results and schedule pH impedance/esophageal manometry test. Case already opened.

## 2020-10-30 NOTE — Telephone Encounter (Signed)
-----   Message from Jenel Lucks, MD sent at 10/30/2020  2:19 PM EDT ----- Alexander Black,  Your gastric emptying study showed rapid emptying (98% empty at 76 minutes).  This strongly suggests against gastroparesis.  This would not explain the large amount of food seen in your stomach during your endoscopy.  Are you positive you did not eat for 8 hours before your EGD?  If you are still having significant symptoms of globus sensation and phlegm, I recommend a pH/impedance study OFF PPI.  This study will give an objective assessment of how much gastroesophageal reflux you are having and if reflux events correlate with your symptoms.   Sheri, can you please assist Alexander Black with getting set up for a pH/impedance test?

## 2020-11-03 ENCOUNTER — Other Ambulatory Visit: Payer: Self-pay

## 2020-11-03 DIAGNOSIS — K3189 Other diseases of stomach and duodenum: Secondary | ICD-10-CM

## 2020-11-03 DIAGNOSIS — R0989 Other specified symptoms and signs involving the circulatory and respiratory systems: Secondary | ICD-10-CM

## 2020-11-03 DIAGNOSIS — R198 Other specified symptoms and signs involving the digestive system and abdomen: Secondary | ICD-10-CM

## 2020-11-03 NOTE — Telephone Encounter (Signed)
See results notes for details.  

## 2020-11-03 NOTE — Telephone Encounter (Signed)
Pt returned your call. Pls call him again at [phone].

## 2020-11-20 DIAGNOSIS — R000 Tachycardia, unspecified: Secondary | ICD-10-CM | POA: Diagnosis not present

## 2020-11-20 DIAGNOSIS — J9601 Acute respiratory failure with hypoxia: Secondary | ICD-10-CM | POA: Diagnosis not present

## 2020-11-20 DIAGNOSIS — R059 Cough, unspecified: Secondary | ICD-10-CM | POA: Diagnosis not present

## 2020-11-26 ENCOUNTER — Encounter (HOSPITAL_COMMUNITY)
Admission: RE | Disposition: A | Discharge status: Home / Self Care | Payer: Self-pay | Source: Ambulatory Visit | Attending: Gastroenterology

## 2020-11-26 ENCOUNTER — Telehealth: Payer: Self-pay | Admitting: Gastroenterology

## 2020-11-26 ENCOUNTER — Ambulatory Visit (HOSPITAL_COMMUNITY)
Admission: RE | Admit: 2020-11-26 | Discharge: 2020-11-26 | Disposition: A | Discharge status: Home / Self Care | Payer: Medicare HMO | Source: Ambulatory Visit | Attending: Gastroenterology | Admitting: Gastroenterology

## 2020-11-26 DIAGNOSIS — K219 Gastro-esophageal reflux disease without esophagitis: Secondary | ICD-10-CM | POA: Diagnosis not present

## 2020-11-26 DIAGNOSIS — R198 Other specified symptoms and signs involving the digestive system and abdomen: Secondary | ICD-10-CM | POA: Diagnosis not present

## 2020-11-26 DIAGNOSIS — R1310 Dysphagia, unspecified: Secondary | ICD-10-CM

## 2020-11-26 HISTORY — PX: ESOPHAGEAL MANOMETRY: SHX5429

## 2020-11-26 SURGERY — MANOMETRY, ESOPHAGUS
Anesthesia: LOCAL

## 2020-11-26 MED ORDER — LIDOCAINE VISCOUS HCL 2 % MT SOLN
OROMUCOSAL | Status: AC
  Filled 2020-11-26: qty 15

## 2020-11-26 SURGICAL SUPPLY — 2 items
FACESHIELD LNG OPTICON STERILE (SAFETY) IMPLANT
GLOVE BIO SURGEON STRL SZ8 (GLOVE) ×4 IMPLANT

## 2020-11-26 NOTE — Progress Notes (Signed)
Esophageal manometry performed per protocol.  Patient tolerated well.  Report to be sent to Dr. Marsa Aris . pH study was not performed, patient did not stop taking PPI . Patient also verbalized fear of having a pH catheter in place for 24 hr. Advised patient to discuss his concerns and options with  Dr.Cunningham.  Dr.Cunningham notified.

## 2020-11-26 NOTE — Telephone Encounter (Signed)
Pt called statong that he only one of the exams today. He was not able to do the 24 Hour PH study. He stated that he had a hard time for 10 minutes trying to do the test. Pt is asking if there is any other test that he could do. Pls call him.

## 2020-11-27 ENCOUNTER — Encounter (HOSPITAL_COMMUNITY): Payer: Self-pay | Admitting: Gastroenterology

## 2020-11-27 ENCOUNTER — Telehealth: Payer: Self-pay

## 2020-11-27 NOTE — Telephone Encounter (Signed)
Called pt to inform about Dr. Milas Hock recommendation below. Pt states he is feeling much better and would rather wait to schedule any further testing. Advised if his symptoms return or worsen, please call the office to schedule a follow up appt. Verbalized acceptance and understanding.  Please found another test for me  (Newest Message First) Jenel Lucks, MD  You 19 hours ago (6:25 PM)   [place] We can do a Bravo probe study.  He will need to be off PPI for this.  Can you please schedule him for an EGD with Bravo probe placement.  I would prefer he be done in the hospital due to his history of respiratory problems, his BMI and the large amount of residual food in his stomach on his last EGD.  He can also schedule a follow up clinic visit to further discuss this.    You  Jenel Lucks, MD 20 hours ago (4:48 PM)   Please refer to pt concern below and advise if you have any alternative orders    Meryl Dare, Alinda Money A  P [facility] Clinical Pool (supporting Jenel Lucks, MD) 23 hours ago (2:11 PM)   Hello can you please found another test for me I can't do that 24 hours ph I was freaking out from 10 minutes test that I get it today please found another way the for me the only way I do it if I'm in hospital for one night   thank you

## 2020-11-28 ENCOUNTER — Telehealth: Payer: Self-pay

## 2020-11-28 NOTE — Telephone Encounter (Signed)
Scheduled patient for 01/14/21 @ 3:40pm

## 2020-11-28 NOTE — Telephone Encounter (Signed)
Called patient and scheduled a follow up appointment for 01/14/21 @ 3:40

## 2020-12-01 NOTE — Progress Notes (Signed)
Called patient for pre op for PH study and he informed me that he just spoke to Dr Tomasa Rand and said that since he was feeling well they decided to cancel procedure. No pre op call necessary.

## 2020-12-04 DIAGNOSIS — R1310 Dysphagia, unspecified: Secondary | ICD-10-CM

## 2020-12-09 ENCOUNTER — Other Ambulatory Visit: Payer: Self-pay | Admitting: Adult Health

## 2020-12-10 ENCOUNTER — Encounter (HOSPITAL_COMMUNITY): Payer: Self-pay

## 2020-12-10 ENCOUNTER — Ambulatory Visit (HOSPITAL_COMMUNITY): Admit: 2020-12-10 | Payer: Medicare HMO | Admitting: Gastroenterology

## 2020-12-10 SURGERY — MONITORING, ESOPHAGEAL PH, 24 HOUR

## 2020-12-20 DIAGNOSIS — J9601 Acute respiratory failure with hypoxia: Secondary | ICD-10-CM | POA: Diagnosis not present

## 2020-12-20 DIAGNOSIS — R059 Cough, unspecified: Secondary | ICD-10-CM | POA: Diagnosis not present

## 2020-12-20 DIAGNOSIS — R000 Tachycardia, unspecified: Secondary | ICD-10-CM | POA: Diagnosis not present

## 2020-12-23 ENCOUNTER — Telehealth: Payer: Self-pay | Admitting: Gastroenterology

## 2020-12-23 NOTE — Telephone Encounter (Signed)
Inbound call from patient requesting a call from a nurse to better explain the results for the esophageal manometry please.

## 2020-12-24 NOTE — Telephone Encounter (Signed)
Pt requesting better explanation of results from his EM done 9/21. Please advise.

## 2020-12-27 ENCOUNTER — Other Ambulatory Visit: Payer: Self-pay | Admitting: Adult Health

## 2020-12-27 DIAGNOSIS — J449 Chronic obstructive pulmonary disease, unspecified: Secondary | ICD-10-CM

## 2020-12-27 NOTE — Telephone Encounter (Signed)
Patient Alexander Black called answsering service over weekend 12/27/2020 11:17 AM - he called saying that  Alexander Black had taken him off albuterol (rview notes cofirm this to be true in June 2022) but he went to pharmacy last week was given albuterol neb ("want my liquid neb") . He is frustrated that we sent albuterol neb but in rerview it seems that the albuterol was a refill when he said "want my liquid neb". Our office had just crated a refill on yupelri (unsigned 12/27/2020 ) but this is unsigned and so not reached pharmacy yet. He ran out of yupelri now . Wants yupleir  Plan  - I esigned and sent order for yupelri  - pls make appt for patient to see APP or [place] =- pls call [facility] and ensure that albuterol is listed as an allergy and no refills (he was very particular about this)  - pls list albuterol in his allergy along with another medicine per Alexander notes in June 2022  Thanks    SIGNATURE    Dr. Kalman Shan, M.D., F.C.C.P,  Pulmonary and Critical Care Medicine Staff Physician, [facility] Center Director - Interstitial Lung Disease  Program  [facility] at [facility] [place], [place], [address]  NPI Number:  NPI #[id number]  Pager: [phone], If no answer  -> Check AMION or Try [phone] Telephone (clinical office): [phone] Telephone (research): [phone]  11:23 AM 12/27/2020    No Known Allergies       has a past medical history of GERD (gastroesophageal reflux disease).  has a past surgical history that includes Leg Surgery; Esophagogastroduodenoscopy (egd) with propofol (N/A, 10/14/2020); and Esophageal manometry (N/A, 11/26/2020).

## 2020-12-29 ENCOUNTER — Other Ambulatory Visit: Payer: Self-pay | Admitting: Adult Health

## 2020-12-29 DIAGNOSIS — J449 Chronic obstructive pulmonary disease, unspecified: Secondary | ICD-10-CM

## 2021-01-14 ENCOUNTER — Encounter: Payer: Self-pay | Admitting: Gastroenterology

## 2021-01-14 ENCOUNTER — Ambulatory Visit (INDEPENDENT_AMBULATORY_CARE_PROVIDER_SITE_OTHER): Payer: Medicare HMO | Admitting: Gastroenterology

## 2021-01-14 VITALS — BP 132/94 | HR 88 | Ht 71.0 in | Wt 337.5 lb

## 2021-01-14 DIAGNOSIS — K219 Gastro-esophageal reflux disease without esophagitis: Secondary | ICD-10-CM | POA: Diagnosis not present

## 2021-01-14 MED ORDER — METOCLOPRAMIDE HCL 10 MG PO TABS: 10.0000 mg | ORAL_TABLET | Freq: Every day | ORAL | 3 refills | Status: DC

## 2021-01-14 NOTE — Patient Instructions (Addendum)
If you are age 53 or older, your body mass index should be between 23-30. Your Body mass index is 47.07 kg/m. If this is out of the aforementioned range listed, please consider follow up with your Primary Care Provider.  If you are age 96 or younger, your body mass index should be between 19-25. Your Body mass index is 47.07 kg/m. If this is out of the aformentioned range listed, please consider follow up with your Primary Care Provider.   Please purchase over the counter Gaviscon 10 ml at bed time.   Decrease Protonix to once a day.    We have sent the following medications to your pharmacy for you to pick up at your convenience: Reglan 10 mg at bedtime.   The [facility] providers would like to encourage you to use [facility] to communicate with providers for non-urgent requests or questions.  Due to long hold times on the telephone, sending your provider a message by [facility] may be a faster and more efficient way to get a response.  Please allow 48 business hours for a response.  Please remember that this is for non-urgent requests.   It was a pleasure to see you today!  Thank you for trusting me with your gastrointestinal care!    Scott E.Tomasa Rand, MD

## 2021-01-14 NOTE — Progress Notes (Signed)
HPI : 55 year old male who I saw in clinic July 20th for follow up of a hospitalization for acute respiratory failure with atypical GERD symptoms characterized by copious frothy sputum and phlegm production with globus sensation.  He has been taking Protonix twice a day.  He underwent an EGD in August which was unremarkable except for a large amount of retained food in the stomach.  There was no significant hiatal hernia noted and no evidence of reflux esophagitis.  Because of the large amount of food and patient's reassurance he did not eat solid food for >8 hours, a gastric emptying study was ordered and was normal.  He was also referred for pH/impedance testing, but he did not tolerate the intranasal catheter and so the procedure was not completed.  He was able to complete the esophageal manometry which was was also unremarkable.   Currently, he reports that his symptoms are much improved, but not resolved.  He continues to have thick phlegm production and throat irritation, but only in the morning (used to be all day).  After an hour or two, the symptoms dissipate. No symptoms of heartburn or acid regurgitation.  No dysphagia.  He has started swimming, and this has improved his mood and his sleep quality at night.  He does not wake up with any GERD symptoms.  He has modified his diet, eliminating snacks and only eating lunch and dinner. He keeps his head of bed elevated at night.   He stopped drinking coffee and started Camomille tea.   He is still taking albuterol and Mucinex which is what he was prescribed at discharge from the hospital in June.    HPI from July 20th, 2022 Mr. Snavely is a 55 year old male referred by Dr. Truett Perna, MD for further evaluation of persistent globus sensation.  The patient states he had no problems with globus sensation or any GERD symptoms until he woke up on May 10th feeling like he was choking and was having significant difficulty breathing.  He ended up requiring  hospitalization for 4 days because of respiratory failure but did not require intubation.  He was treated with steroids, nebulizers and acid suppression.  Ever since then, he feels like there is something stuck in his throat and "squeezing" his airway.  He is bothered by constant mucus and phlegm production.  The patient admits that in the past he had issues with mucus in his throat, but this was only when he woke up in the morning and when he had a bowel movement.  Currently, the patient states that mucus is constantly being brought up into his throat all day every day.  He was evaluated by ENT while hospitalized, and a nasopharyngoscopy was unremarkable.  A CT of the chest was also unremarkable.  A barium swallow was essentially normal as well, demonstrating no reflux, stricture or significant motility abnormalities. The patient denies typical symptoms of GERD, to include heartburn or acid regurgitation.  He also denies any difficulties with swallowing or feeling the need to wash food down.  He does complain of lots of sneezing, and decreased senses of taste and smell.  He is prescribed Claritin and Singulair currently.  He is still taking pantoprazole as well.  None of these therapies have improved his globus sensation or phlegm production. His weight has fluctuated somewhat but has mostly been increasing over the past few years.  He currently weighs 321 pounds.  At hospital discharge in May he weighed 314.  The heaviest  he has been is 350 pounds.  He is distressed about his weight and is seeking help, particularly asking about weight loss medications.  He states he is unable to do any exercise because of his numerous lower extremity surgeries and subsequent leg dysfunction   Past Medical History:  Diagnosis Date   Gastroparesis    GERD (gastroesophageal reflux disease)      Past Surgical History:  Procedure Laterality Date   ESOPHAGEAL MANOMETRY N/A 11/26/2020   Procedure: ESOPHAGEAL MANOMETRY  (EM);  Surgeon: Jenel Lucks, MD;  Location: [facility];  Service: Gastroenterology;  Laterality: N/A;   ESOPHAGOGASTRODUODENOSCOPY (EGD) WITH PROPOFOL N/A 10/14/2020   Procedure: ESOPHAGOGASTRODUODENOSCOPY (EGD) WITH PROPOFOL;  Surgeon: Jenel Lucks, MD;  Location: [facility];  Service: Gastroenterology;  Laterality: N/A;   LEG SURGERY     History reviewed. No pertinent family history. Social History   Tobacco Use   Smoking status: Some Days   Smokeless tobacco: Never   Tobacco comments:    pt reports smoking marijuana occaisionally in [place] for orthopedic pain  Vaping Use   Vaping Use: Never used  Substance Use Topics   Alcohol use: Not Currently   Drug use: Not Currently   Current Outpatient Medications  Medication Sig Dispense Refill   Ascorbic Acid (VITAMIN C) 1000 MG tablet Take 1,000 mg by mouth daily.     Cholecalciferol (VITAMIN D) 50 MCG (2000 UT) tablet Take 4,000 Units by mouth every other day. At bedtime     famotidine (PEPCID) 40 MG tablet TAKE 1 TABLET(40 MG) BY MOUTH AT BEDTIME 30 tablet 3   guaiFENesin (MUCINEX) 600 MG 12 hr tablet Take 600 mg by mouth 2 (two) times daily.     loratadine (CLARITIN) 10 MG tablet TAKE 1 TABLET(10 MG) BY MOUTH DAILY 30 tablet 3   montelukast (SINGULAIR) 10 MG tablet TAKE 1 TABLET(10 MG) BY MOUTH AT BEDTIME 30 tablet 3   pantoprazole (PROTONIX) 40 MG tablet Take 1 tablet (40 mg total) by mouth 2 (two) times daily. 60 tablet 0   zinc gluconate 50 MG tablet Take 50 mg by mouth daily with supper.     No current facility-administered medications for this visit.   Allergies  Allergen Reactions   Albuterol Shortness Of Breath     Review of Systems: All systems reviewed and negative except where noted in HPI.    No results found.  Physical Exam: BP (!) 132/94 (BP Location: Left Arm, Patient Position: Sitting, Cuff Size: Normal)   Pulse 88   Ht 5\' 11"  (1.803 m)   Wt (!) 337 lb 8 oz (153.1 kg)   BMI 47.07 kg/m   Constitutional: Pleasant,well-developed, obese male in no acute distress, seated in wheelchair. HEENT: Normocephalic and atraumatic. Conjunctivae are normal. No scleral icterus. Neurological: Alert and oriented to person place and time. Skin: Skin is warm and dry. No rashes noted. Psychiatric: Normal mood and affect. Behavior is normal.  CBC    Component Value Date/Time   WBC 7.4 07/25/2020 0326   RBC 4.93 07/25/2020 0326   HGB 13.5 07/25/2020 0326   HCT 38.3 (L) 07/25/2020 0326   PLT 276 07/25/2020 0326   MCV 77.7 (L) 07/25/2020 0326   MCH 27.4 07/25/2020 0326   MCHC 35.2 07/25/2020 0326   RDW 14.8 07/25/2020 0326   LYMPHSABS 2.5 07/23/2020 2046   MONOABS 0.7 07/23/2020 2046   EOSABS 0.2 07/23/2020 2046   BASOSABS 0.0 07/23/2020 2046    CMP     Component  Value Date/Time   NA 128 (L) 07/25/2020 0326   K 3.2 (L) 07/25/2020 0326   CL 97 (L) 07/25/2020 0326   CO2 21 (L) 07/25/2020 0326   GLUCOSE 104 (H) 07/25/2020 0326   BUN 8 07/25/2020 0326   CREATININE 0.74 07/25/2020 0326   CALCIUM 8.4 (L) 07/25/2020 0326   PROT 6.6 07/16/2020 0537   ALBUMIN 3.5 07/16/2020 0537   AST 18 07/16/2020 0537   ALT 23 07/16/2020 0537   ALKPHOS 64 07/16/2020 0537   BILITOT 0.5 07/16/2020 0537   GFRNONAA >60 07/25/2020 0326     ASSESSMENT AND PLAN: 55 year old male with obesity and atypical GERD symptoms consisting of copious phlegm production and globus sensation, with partial PPI response.  Although his symptoms may be due to other etiologies, I suspect GERD based on the morning predominance We discussed the pathophysiology of GERD and the principles of GERD management to include lifestyle modifications  such as dietary discretion (avoidance of alcohol, tobacco, caffeinated and carbonated beverages, spicy/greasy foods, citrus, peppermint/chocolate), weight loss if applicable, head of bed elevation andconsuming last meal of day within 3 hours of bedtime; pharmacologic options to include  PPIs, H2RAs and OTC antacids; and finally surgical or endoscopic fundoplication.  The patient is not a fundoplication candidate, but would potentially be a candidate for bariatric surgery, which is typically effective for reflux. I recommended he continue with the diet and exercise habits he has recently initiated, as this will eventually lead to weight loss and likely improved GERD symptoms In the meantime, I recommend he try Reglan 10 mg qhs and nightly gaviscon.  I suggested he decrease his Protonix to once daily  GERD, nonerosive - Decrease Protonix to once daily - Reglan 10 mg qhs - Gaviscon qhs - Continue behavioral modifications/weight loss  Jessicamarie Amiri E. Tomasa Rand, MD [facility]   CC:  Truett Perna, MD

## 2021-01-28 ENCOUNTER — Other Ambulatory Visit: Payer: Self-pay | Admitting: Adult Health

## 2021-04-17 ENCOUNTER — Encounter: Payer: Self-pay | Admitting: Gastroenterology

## 2021-04-17 NOTE — Telephone Encounter (Signed)
Referral placed. Pt made aware. Nothing further needed.

## 2021-04-17 NOTE — Telephone Encounter (Signed)
Mychart message sent by pt: Alexander Black  P [facility] Pool (supporting Parrett, Tammy S, NP) 2 hours ago (11:56 AM)   Hello I been told to many time Im overweight and obesity  from my doctors and loosing weight will help me to feel better on my sick problem can you please ask my doctor if thats okay a  to use WEGOVY  Wegovy (semaglutide) is a once-weekly injectable GLP-1 drug that was approved by the FDA to treat overweight and obesity ? If the doctor say yes and that will help me to get ready off my sick problem that I have pleas [facility] to get it and let me know please ? Thank you       Tammy, please advise.

## 2021-04-17 NOTE — Telephone Encounter (Signed)
Morbid obesity  

## 2021-04-17 NOTE — Telephone Encounter (Signed)
Pt said he would like to have referral placed. Tammy, please advise on what diagnosis to use.

## 2021-04-17 NOTE — Telephone Encounter (Signed)
I do not prescribe weight loss meds. I am glad to refer to healthy weight and wellness for evaluation

## 2021-04-28 ENCOUNTER — Other Ambulatory Visit: Payer: Self-pay | Admitting: Adult Health

## 2021-09-17 IMAGING — DX DG CHEST 1V PORT
2 series · 2 of 2 positions shown · non-contrast
Comparison: Chest radiograph dated 07/13/2020.

CLINICAL DATA: Shortness of breath, cough.

EXAM:
PORTABLE CHEST 1 VIEW

[chest ap (1 of 2)]
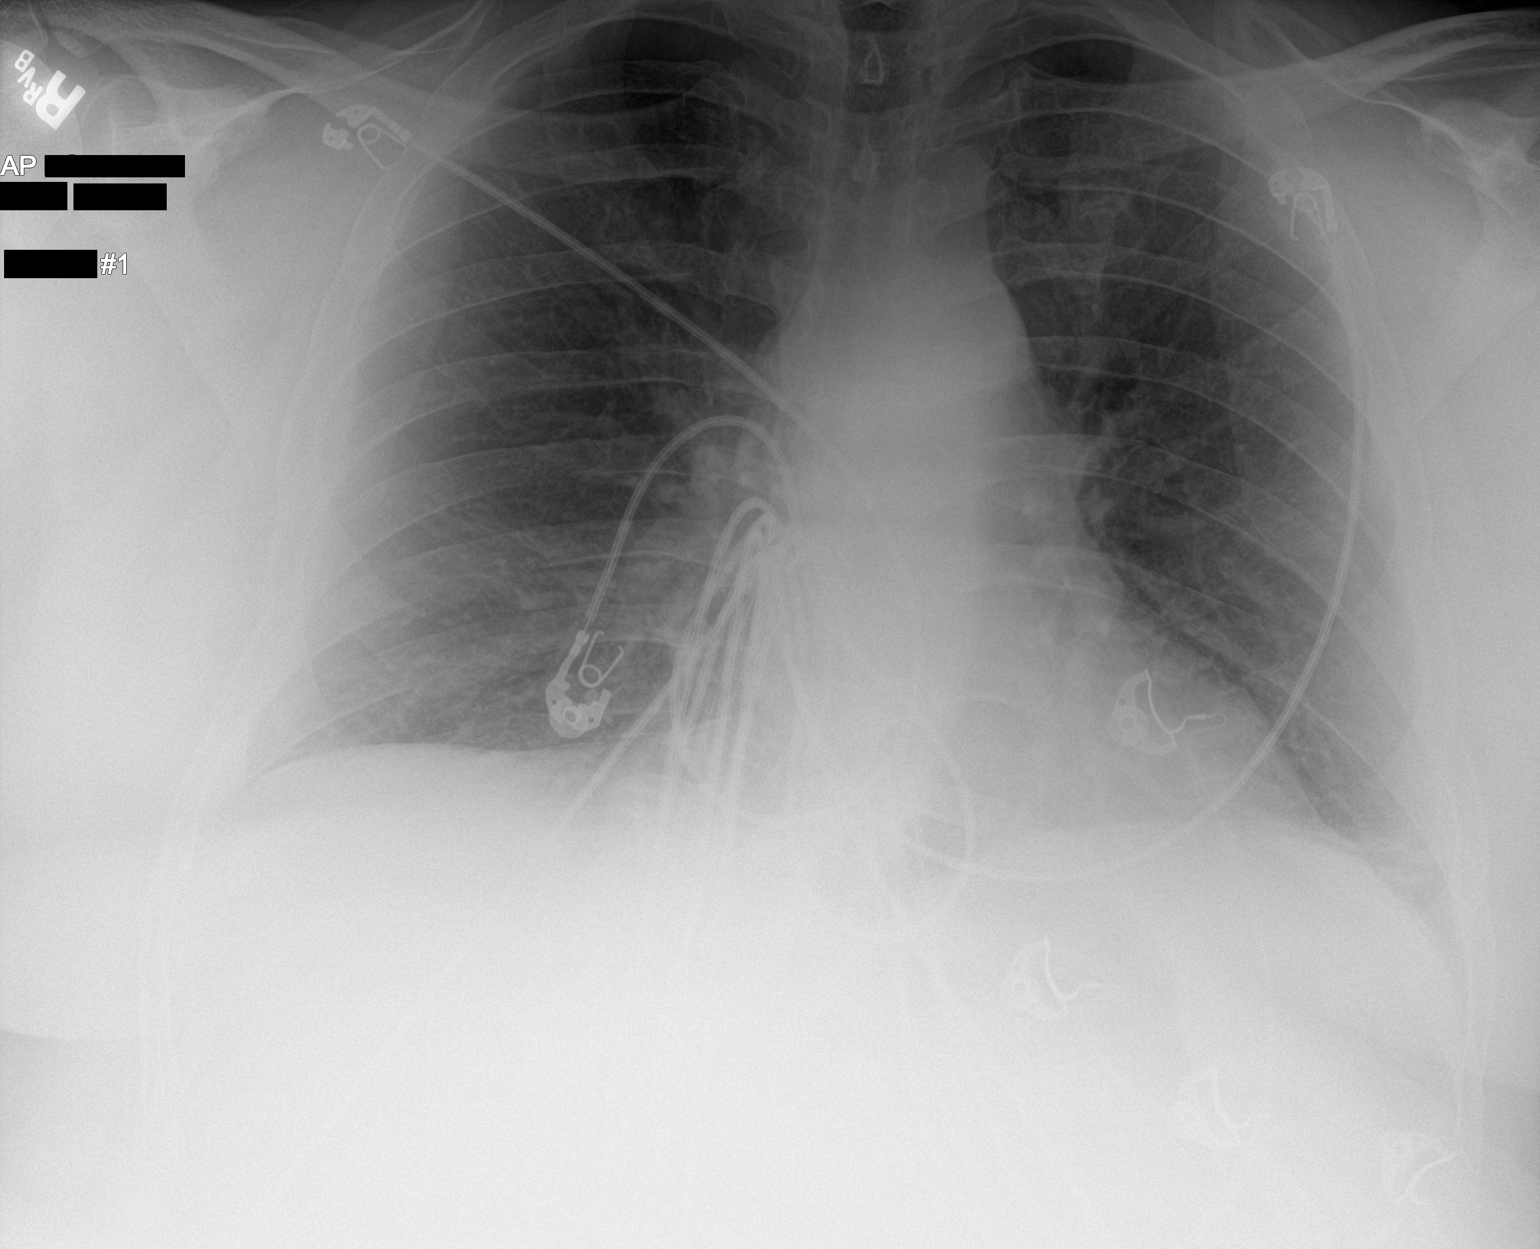

[chest ap (2 of 2)]
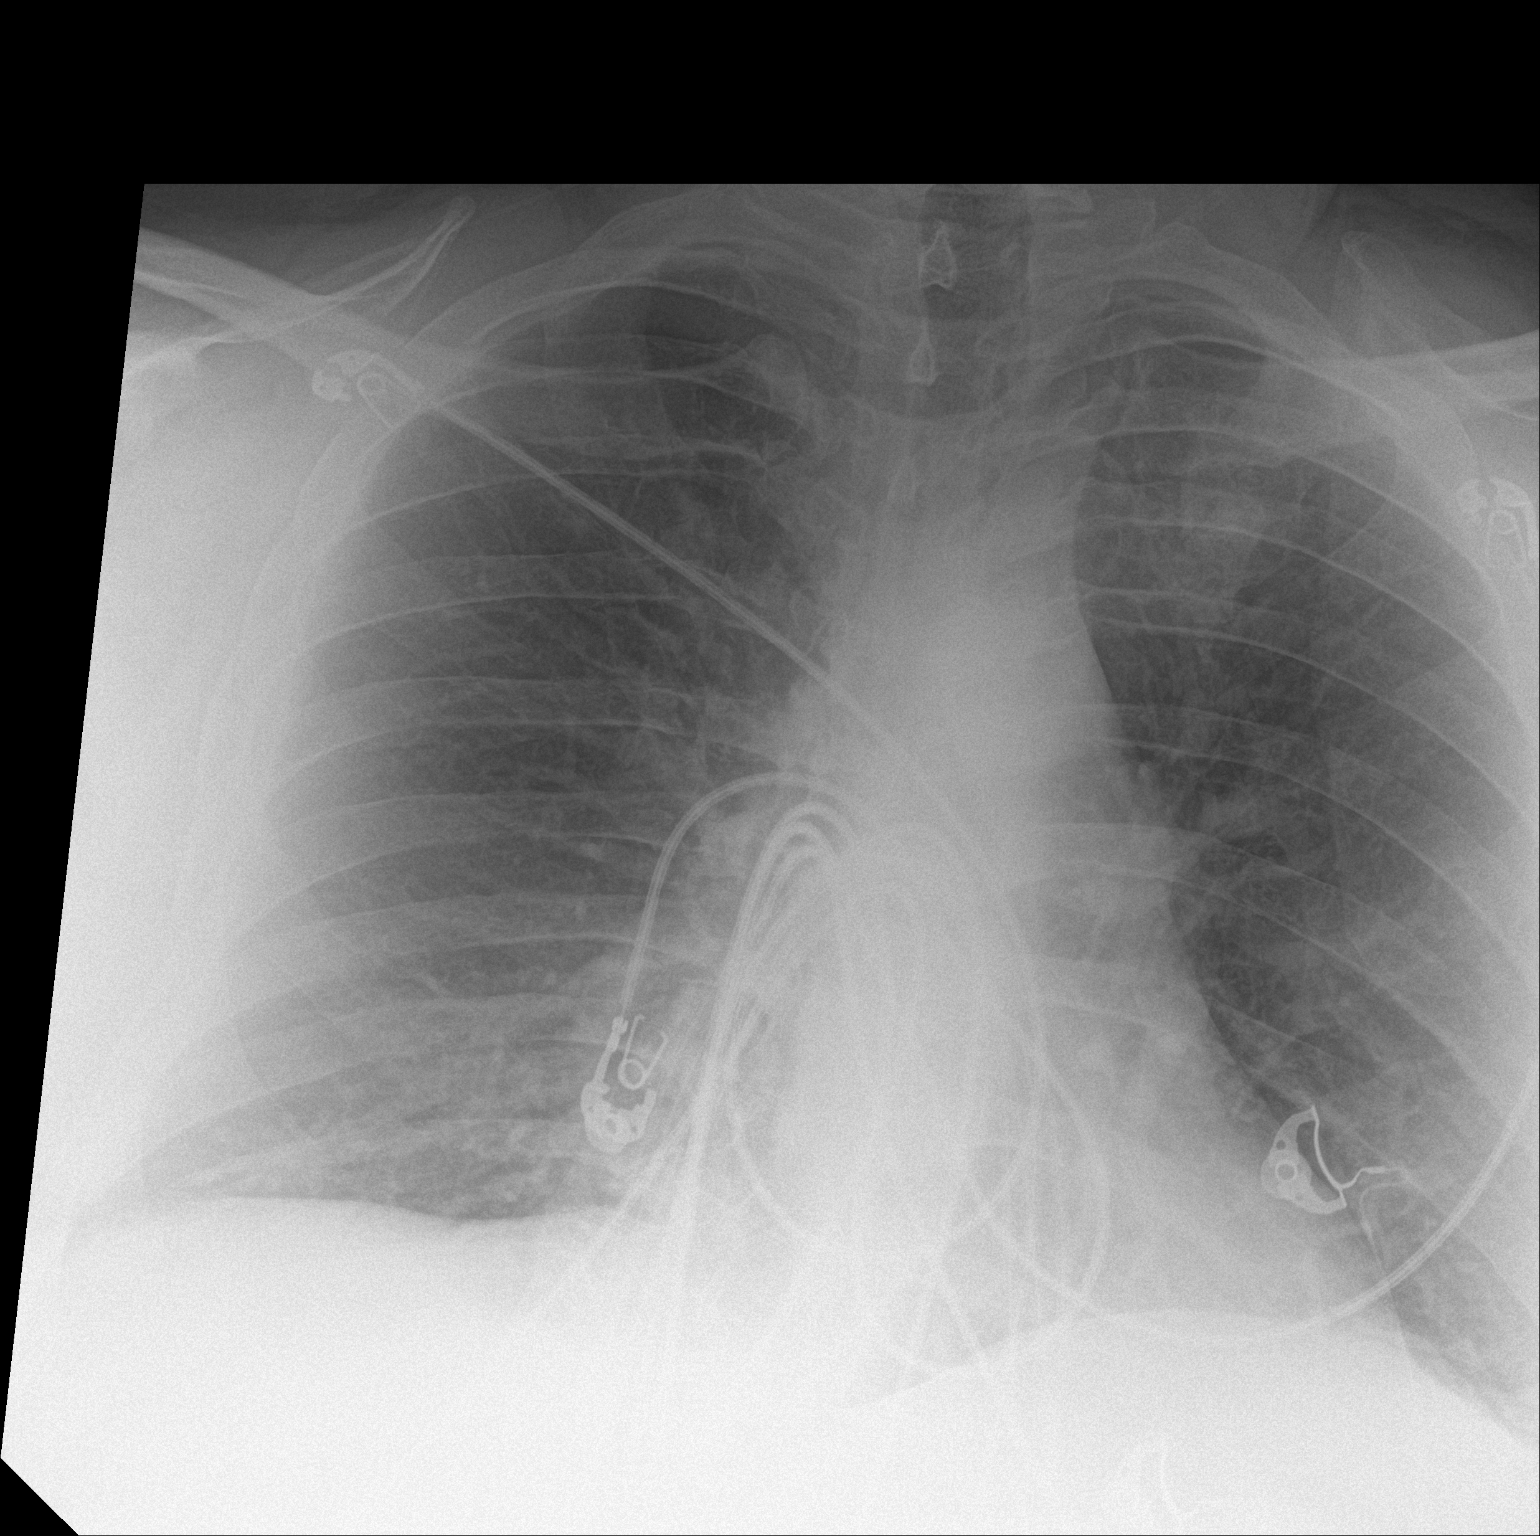

[2 of 2 positions shown; findings below may reference images not displayed]

FINDINGS: The heart size and mediastinal contours are within normal limits.
There is suggestion of mild bibasilar atelectasis/airspace disease.
No pleural effusion or pneumothorax. The visualized skeletal
structures are unremarkable.
IMPRESSION: Suggestion of mild bibasilar atelectasis/airspace disease.

## 2021-09-25 IMAGING — CR DG CHEST 2V
2 series · 2 of 2 positions shown · non-contrast
Comparison: 07/15/2020

CLINICAL DATA: Shortness of breath

EXAM:
CHEST - 2 VIEW

[chest lat]
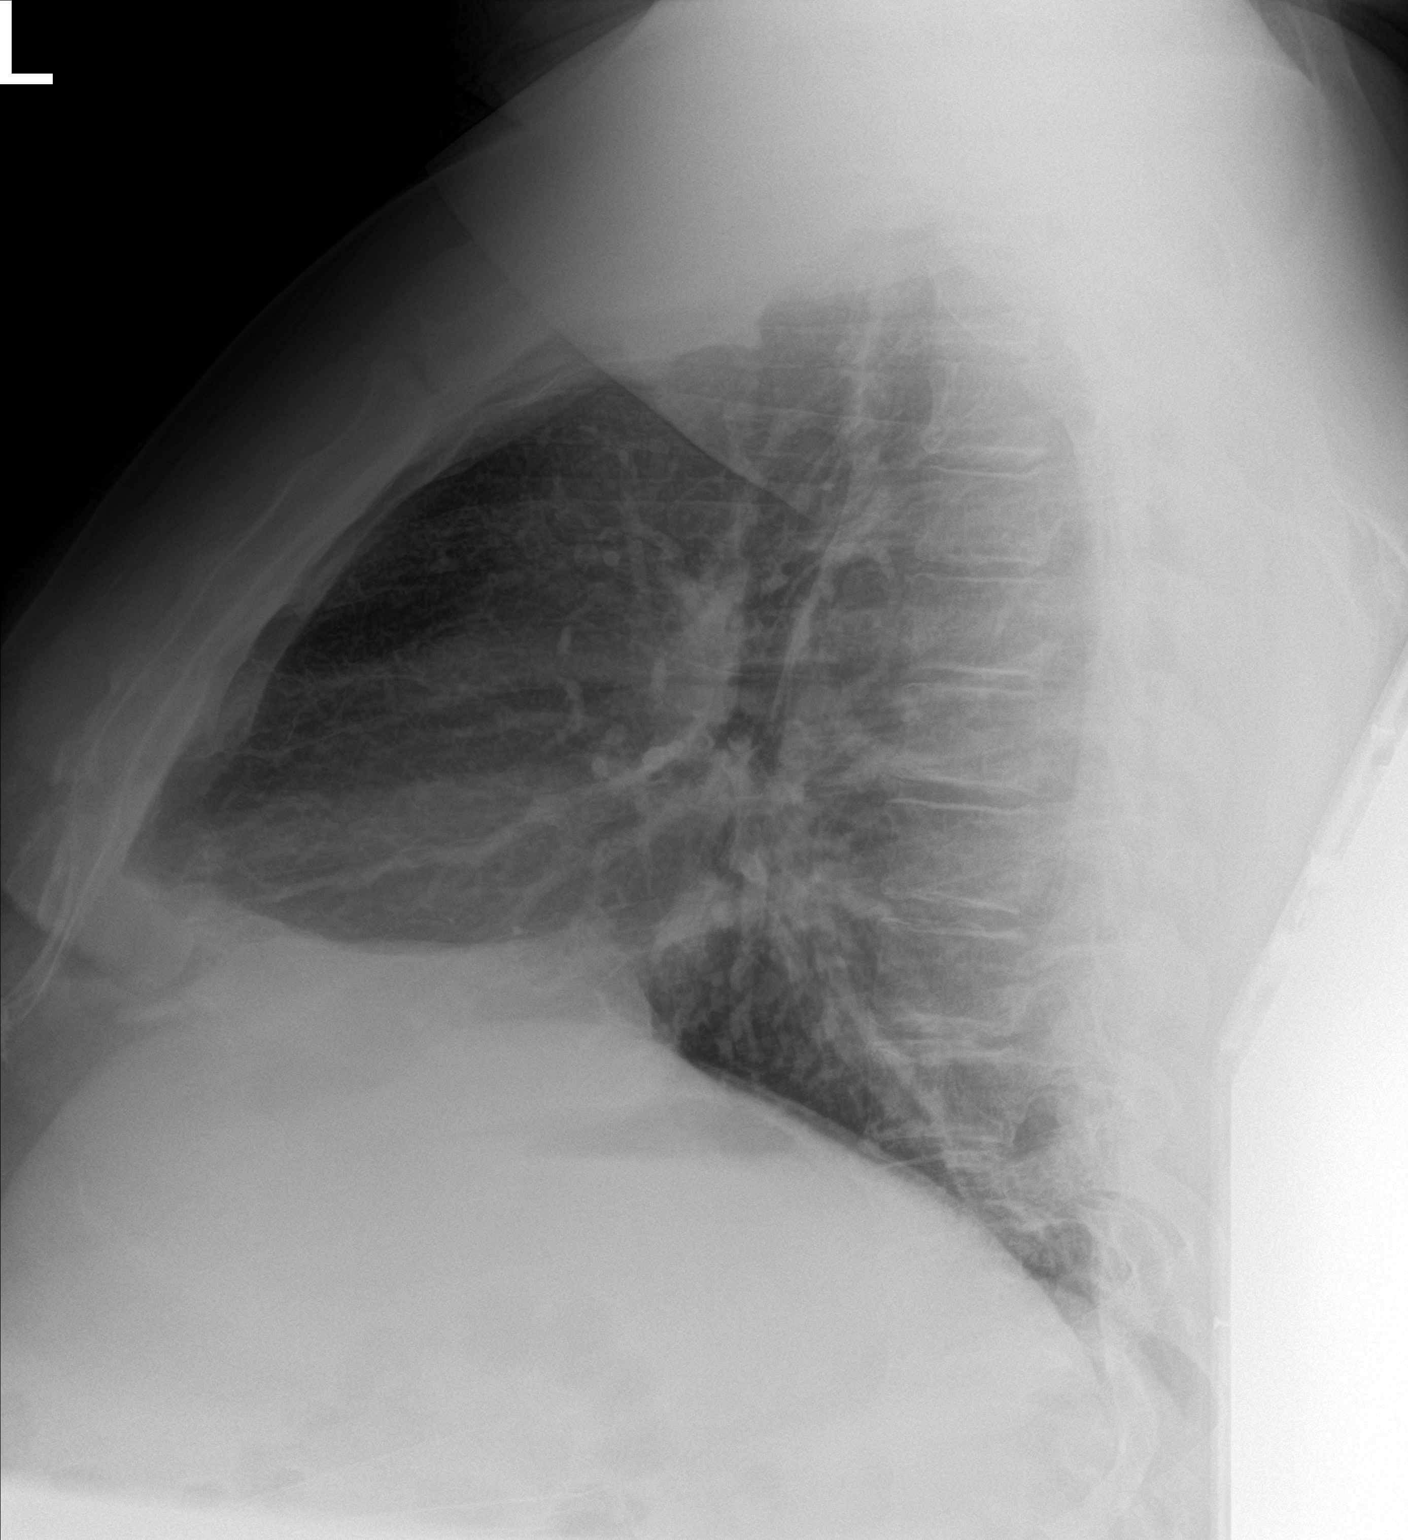

[chest ap]
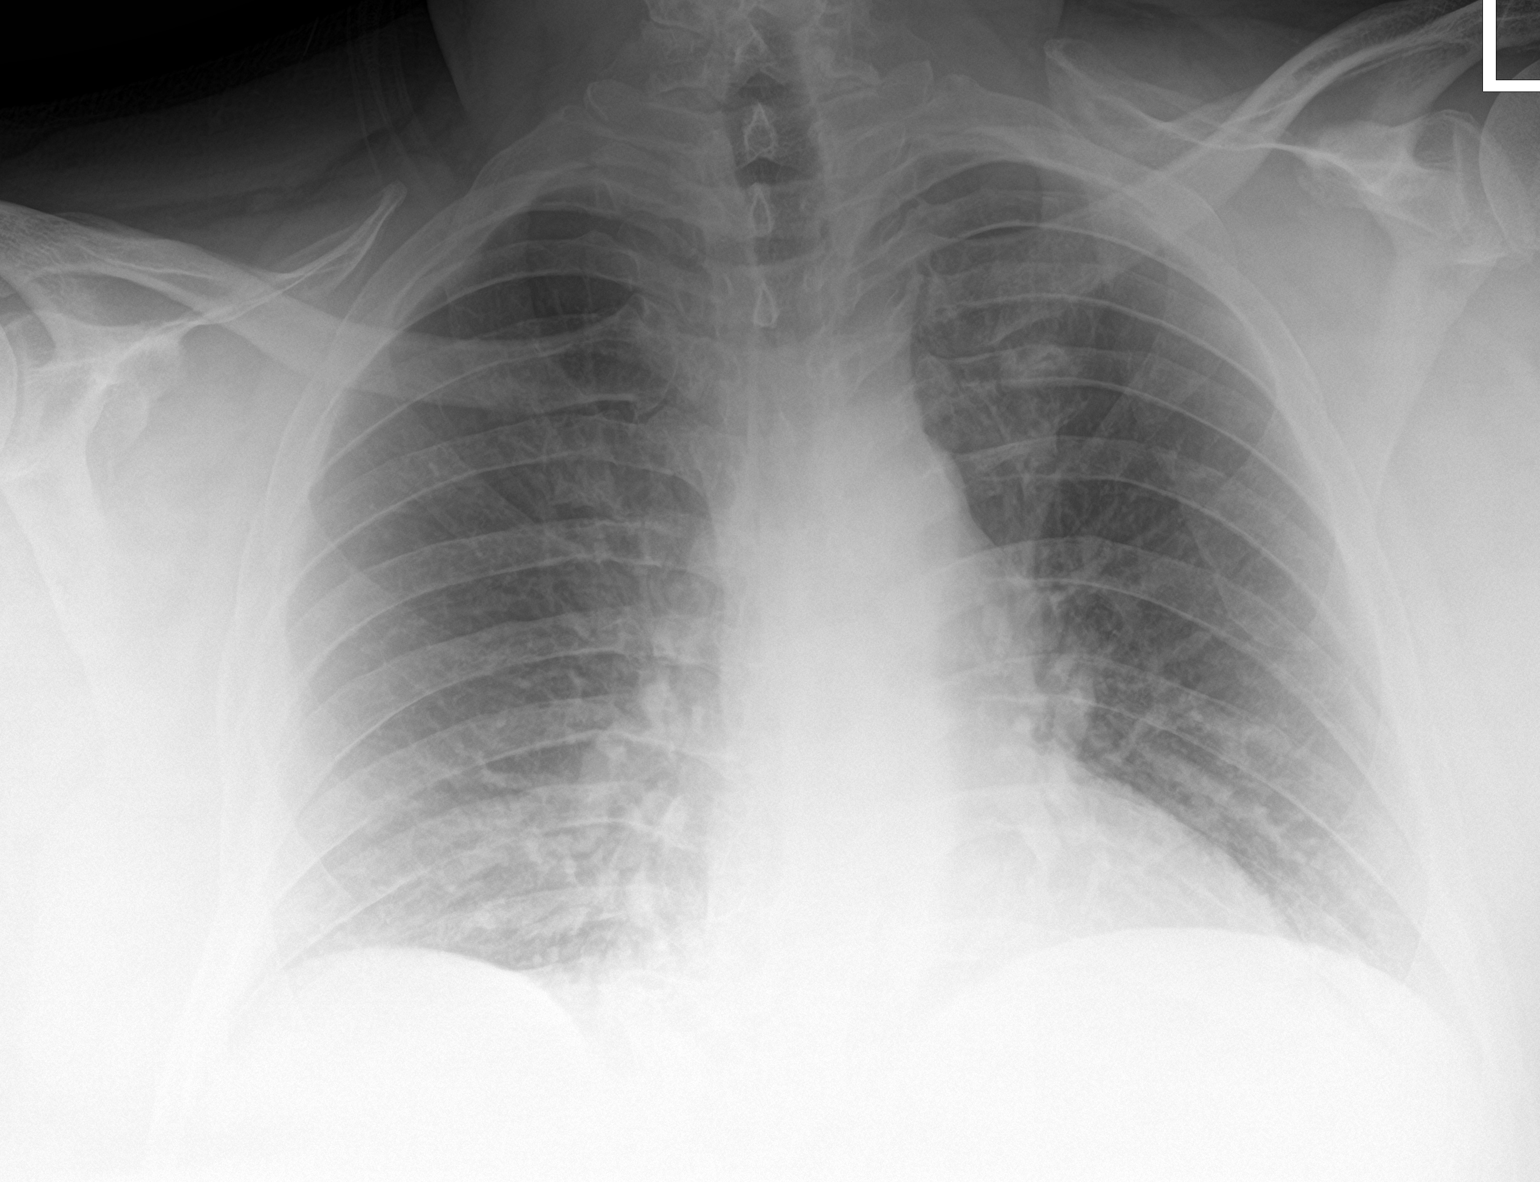

[2 of 2 positions shown; findings below may reference images not displayed]

FINDINGS: Cardiac shadow is stable. The lungs are well aerated bilaterally. No
focal infiltrate or sizable effusion is seen. No bony abnormality is
noted.
IMPRESSION: No acute abnormality noted.

## 2021-09-28 NOTE — Patient Instructions (Incomplete)
It was nice seeing you today!  Try taking Gaviscon for GERD/spit-up.  Schedule follow-up visit at your convenience to discuss your rash and hand nodule.  Stay well, Littie Deeds, MD [facility] [phone]  --  Make sure to check out at the front desk before you leave today.  Please arrive at least 15 minutes prior to your scheduled appointments.  If you had blood work today, I will send you a MyChart message or a letter if results are normal. Otherwise, I will give you a call.  If you had a referral placed, they will call you to set up an appointment. Please give [place] a call if you don't hear back in the next 2 weeks.  If you need additional refills before your next appointment, please call your pharmacy first.

## 2021-09-28 NOTE — Progress Notes (Unsigned)
    SUBJECTIVE:   CHIEF COMPLAINT / HPI:  No chief complaint on file.   ***  PERTINENT  PMH / PSH: ***  Patient Care Team: Truett Perna, MD as PCP - General (Internal Medicine)   OBJECTIVE:   There were no vitals taken for this visit.  Physical Exam       No data to display           {Show previous vital signs (optional):23777}  {Labs  Heme  Chem  Endocrine  Serology  Results Review (optional):23779}  ASSESSMENT/PLAN:   No problem-specific Assessment & Plan notes found for this encounter.   HCM - HCV screening - Tdap - colonoscopy - shingles vaccine  No follow-ups on file.   Littie Deeds, MD [facility]

## 2021-09-29 ENCOUNTER — Encounter: Payer: Self-pay | Admitting: Family Medicine

## 2021-09-29 ENCOUNTER — Ambulatory Visit (INDEPENDENT_AMBULATORY_CARE_PROVIDER_SITE_OTHER): Payer: Medicare HMO | Admitting: Family Medicine

## 2021-09-29 VITALS — BP 120/76 | HR 74 | Temp 98.3°F | Resp 18 | Ht 71.0 in | Wt 293.4 lb

## 2021-09-29 DIAGNOSIS — K219 Gastro-esophageal reflux disease without esophagitis: Secondary | ICD-10-CM

## 2021-09-29 DIAGNOSIS — Z13220 Encounter for screening for lipoid disorders: Secondary | ICD-10-CM

## 2021-09-29 DIAGNOSIS — R739 Hyperglycemia, unspecified: Secondary | ICD-10-CM

## 2021-09-29 DIAGNOSIS — Z1159 Encounter for screening for other viral diseases: Secondary | ICD-10-CM

## 2021-09-29 DIAGNOSIS — Z7689 Persons encountering health services in other specified circumstances: Secondary | ICD-10-CM | POA: Diagnosis not present

## 2021-09-29 NOTE — Assessment & Plan Note (Signed)
Continue pantoprazole daily. Unclear benefit of supplements he is on but can continue if he finds them helpful. - advised to trial gaviscon in addition

## 2021-09-30 LAB — HCV INTERPRETATION

## 2021-09-30 LAB — COMPREHENSIVE METABOLIC PANEL
ALT: 18 IU/L (ref 0–44)
AST: 18 IU/L (ref 0–40)
Albumin/Globulin Ratio: 1.5 (ref 1.2–2.2)
Albumin: 4.3 g/dL (ref 3.8–4.9)
Alkaline Phosphatase: 117 IU/L (ref 44–121)
BUN/Creatinine Ratio: 23 — ABNORMAL HIGH (ref 9–20)
BUN: 18 mg/dL (ref 6–24)
Bilirubin Total: 0.3 mg/dL (ref 0.0–1.2)
CO2: 21 mmol/L (ref 20–29)
Calcium: 9 mg/dL (ref 8.7–10.2)
Chloride: 105 mmol/L (ref 96–106)
Creatinine, Ser: 0.77 mg/dL (ref 0.76–1.27)
Globulin, Total: 2.8 g/dL (ref 1.5–4.5)
Glucose: 88 mg/dL (ref 70–99)
Potassium: 4.7 mmol/L (ref 3.5–5.2)
Sodium: 141 mmol/L (ref 134–144)
Total Protein: 7.1 g/dL (ref 6.0–8.5)
eGFR: 106 mL/min/{1.73_m2} (ref >59)

## 2021-09-30 LAB — LIPID PANEL
Chol/HDL Ratio: 3.3 ratio (ref 0.0–5.0)
Cholesterol, Total: 146 mg/dL (ref 100–199)
HDL: 44 mg/dL (ref >39)
LDL Chol Calc (NIH): 89 mg/dL (ref 0–99)
Triglycerides: 63 mg/dL (ref 0–149)
VLDL Cholesterol Cal: 13 mg/dL (ref 5–40)

## 2021-09-30 LAB — HEMOGLOBIN A1C
Est. average glucose Bld gHb Est-mCnc: 108 mg/dL
Hgb A1c MFr Bld: 5.4 % (ref 4.8–5.6)

## 2021-09-30 LAB — CBC
Hematocrit: 45.4 % (ref 37.5–51.0)
Hemoglobin: 14.8 g/dL (ref 13.0–17.7)
MCH: 26.1 pg — ABNORMAL LOW (ref 26.6–33.0)
MCHC: 32.6 g/dL (ref 31.5–35.7)
MCV: 80 fL (ref 79–97)
Platelets: 266 10*3/uL (ref 150–450)
RBC: 5.68 x10E6/uL (ref 4.14–5.80)
RDW: 15.4 % (ref 11.6–15.4)
WBC: 4.9 10*3/uL (ref 3.4–10.8)

## 2021-09-30 LAB — HCV AB W REFLEX TO QUANT PCR: HCV Ab: NONREACTIVE

## 2021-10-27 ENCOUNTER — Ambulatory Visit (INDEPENDENT_AMBULATORY_CARE_PROVIDER_SITE_OTHER): Payer: Medicare HMO | Admitting: Gastroenterology

## 2021-10-27 ENCOUNTER — Encounter: Payer: Self-pay | Admitting: Gastroenterology

## 2021-10-27 VITALS — BP 132/80 | HR 82 | Ht 71.0 in | Wt 290.0 lb

## 2021-10-27 DIAGNOSIS — K219 Gastro-esophageal reflux disease without esophagitis: Secondary | ICD-10-CM

## 2021-10-27 DIAGNOSIS — R0989 Other specified symptoms and signs involving the circulatory and respiratory systems: Secondary | ICD-10-CM

## 2021-10-27 MED ORDER — METOCLOPRAMIDE HCL 10 MG PO TABS: 10.0000 mg | ORAL_TABLET | Freq: Every day | ORAL | 1 refills | Status: DC

## 2021-10-27 NOTE — Progress Notes (Signed)
HPI : Alexander Black is a very pleasant 56 year old male who I initially saw in July 2022 for symptoms of globus sensation and excessive phlegm.  He underwent an upper endoscopy in August 2022 which was unremarkable except for large amount of retained food in the stomach.  There was no significant hiatal hernia and no objective acid reflux.  He subsequently underwent a gastric emptying study which was normal.  pH/impedance test was recommended, but he did not tolerate passage of the intranasal catheter so the procedure was not performed, although esophageal manometry was completed and was unremarkable. He was last seen in follow-up in November at which time his symptoms were much improved, not completely resolved.  He had made several paver modifications to improve GERD symptoms.  He was recommended to try Gaviscon at night as well as Reglan.  He reports that the Gaviscon seem to make his symptoms worse, and he did not end up taking the Reglan due to the concerns of potential tardive dyskinesia Today, patient reports ongoing issues with phlegm production and globus sensation, although the symptoms are much less bothersome than they were a year ago.  The patient has been able to lose a fair amount of weight over the last year (going from a peak of 342 around 285).  His physical activity and mobility are increasing.  He continues to deny any typical reflux symptoms such as heart acid regurgitation.  No dysphagia.  No nausea or vomiting.  He often wakes up from sleep feeling something is in his throat. He is eating a high-protein/carnivore diet.  He only eats twice a day.  He is eliminated coffee and does not drink alcohol. He has been taking a magnesium supplement which helps him sleep at night and wonders if this is okay for reflux.  He is also been taking a probiotic which she thought would help with reflux.  He has normal bowel movements and denies any problems with constipation, diarrhea, blood in the stool  or bloating. He had labs recently this morning through his primary care provider which showed a normal CBC, CMP, negative hepatitis C antibody, normal hemoglobin A1c and lipid panel.     Past Medical History:  Diagnosis Date   GERD (gastroesophageal reflux disease)      Past Surgical History:  Procedure Laterality Date   ESOPHAGEAL MANOMETRY N/A 11/26/2020   Procedure: ESOPHAGEAL MANOMETRY (EM);  Surgeon: Jenel Lucks, MD;  Location: [facility];  Service: Gastroenterology;  Laterality: N/A;   ESOPHAGOGASTRODUODENOSCOPY (EGD) WITH PROPOFOL N/A 10/14/2020   Procedure: ESOPHAGOGASTRODUODENOSCOPY (EGD) WITH PROPOFOL;  Surgeon: Jenel Lucks, MD;  Location: [facility];  Service: Gastroenterology;  Laterality: N/A;   LEG SURGERY     No family history on file. Social History   Tobacco Use   Smoking status: Some Days   Smokeless tobacco: Never   Tobacco comments:    pt reports smoking marijuana occaisionally in [place] for orthopedic pain  Vaping Use   Vaping Use: Never used  Substance Use Topics   Alcohol use: Not Currently   Drug use: Not Currently   Current Outpatient Medications  Medication Sig Dispense Refill   pantoprazole (PROTONIX) 40 MG tablet Take 1 tablet (40 mg total) by mouth 2 (two) times daily. (Patient taking differently: Take 40 mg by mouth daily.) 60 tablet 0   No current facility-administered medications for this visit.   Allergies  Allergen Reactions   Albuterol Shortness Of Breath     Review of Systems: All systems  reviewed and negative except where noted in HPI.    No results found.  Physical Exam: BP 132/80   Pulse 82   Ht 5\' 11"  (1.803 m)   Wt 290 lb (131.5 kg)   SpO2 96%   BMI 40.45 kg/m  Constitutional: Pleasant,well-developed, obese Middle male in no acute distress. Neurological: Alert and oriented to person place and time. Skin: Skin is warm and dry. No rashes noted. Psychiatric: Normal mood and affect. Behavior is  normal.  CBC    Component Value Date/Time   WBC 4.9 09/29/2021 1642   WBC 7.4 07/25/2020 0326   RBC 5.68 09/29/2021 1642   RBC 4.93 07/25/2020 0326   HGB 14.8 09/29/2021 1642   HCT 45.4 09/29/2021 1642   PLT 266 09/29/2021 1642   MCV 80 09/29/2021 1642   MCH 26.1 (L) 09/29/2021 1642   MCH 27.4 07/25/2020 0326   MCHC 32.6 09/29/2021 1642   MCHC 35.2 07/25/2020 0326   RDW 15.4 09/29/2021 1642   LYMPHSABS 2.5 07/23/2020 2046   MONOABS 0.7 07/23/2020 2046   EOSABS 0.2 07/23/2020 2046   BASOSABS 0.0 07/23/2020 2046    CMP     Component Value Date/Time   NA 141 09/29/2021 1642   K 4.7 09/29/2021 1642   CL 105 09/29/2021 1642   CO2 21 09/29/2021 1642   GLUCOSE 88 09/29/2021 1642   GLUCOSE 104 (H) 07/25/2020 0326   BUN 18 09/29/2021 1642   CREATININE 0.77 09/29/2021 1642   CALCIUM 9.0 09/29/2021 1642   PROT 7.1 09/29/2021 1642   ALBUMIN 4.3 09/29/2021 1642   AST 18 09/29/2021 1642   ALT 18 09/29/2021 1642   ALKPHOS 117 09/29/2021 1642   BILITOT 0.3 09/29/2021 1642   GFRNONAA >60 07/25/2020 0326     ASSESSMENT AND PLAN: 56 year old male with persistent atypical GERD symptoms of globus sensation and phlegm production, with some improvement in symptoms with Protonix and antireflux measures.  He does not tolerate the addition of Gaviscon previously.  He is looking for any other suggestions as to what may help improve his symptoms.  We discussed again the pathophysiology and management of GERD.  He is not a candidate for fundoplication due to his BMI.  Bariatric surgery would not be unreasonable, particularly given his strong desire to lose weight, but given that he has been successful in losing weight through diet and exercise, I think it may be best for him to continue noninvasive weight loss measures.  Patient is also not very interested in surgery at this time.   I suspect most of his symptoms are from nonacid reflux, and I do not think switching PPI would necessarily be  helpful.  Although his gastric emptying study was normal, given the presence of food on his EGD and his report of frequent nighttime symptoms, I do think a trial of Reglan before bed may be able to help him.  We again discussed the symptoms of tardive dyskinesia and I instructed him to stop taking the Reglan should he notice any such symptoms.  Globus sensation, suspect GERD - Add Reglan 10 mg nightly - Continue Protonix 40 mg daily  Marialuisa Basara E. 59, MD [facility]   CC:  Tomasa Rand, MD

## 2021-10-27 NOTE — Patient Instructions (Signed)
_______________________________________________________  If you are age 54 or older, your body mass index should be between 23-30. Your Body mass index is 40.45 kg/m. If this is out of the aforementioned range listed, please consider follow up with your Primary Care Provider.  If you are age 6 or younger, your body mass index should be between 19-25. Your Body mass index is 40.45 kg/m. If this is out of the aformentioned range listed, please consider follow up with your Primary Care Provider.   We have sent the following medications to your pharmacy for you to pick up at your convenience:Reglan 10 mg    The [place] providers would like to encourage you to use [facility] to communicate with providers for non-urgent requests or questions.  Due to long hold times on the telephone, sending your provider a message by [facility] may be a faster and more efficient way to get a response.  Please allow 48 business hours for a response.  Please remember that this is for non-urgent requests.   It was a pleasure to see you today!  Thank you for trusting me with your gastrointestinal care!    Scott E.Tomasa Rand, MD

## 2021-11-02 ENCOUNTER — Ambulatory Visit (INDEPENDENT_AMBULATORY_CARE_PROVIDER_SITE_OTHER): Payer: Medicare HMO | Admitting: Family Medicine

## 2021-11-16 ENCOUNTER — Ambulatory Visit (INDEPENDENT_AMBULATORY_CARE_PROVIDER_SITE_OTHER): Payer: Medicare HMO | Admitting: Family Medicine

## 2021-11-16 ENCOUNTER — Encounter (INDEPENDENT_AMBULATORY_CARE_PROVIDER_SITE_OTHER): Payer: Self-pay

## 2021-11-30 ENCOUNTER — Ambulatory Visit (INDEPENDENT_AMBULATORY_CARE_PROVIDER_SITE_OTHER): Payer: Medicare HMO | Admitting: Family Medicine

## 2022-01-06 ENCOUNTER — Telehealth: Payer: Self-pay | Admitting: Family Medicine

## 2022-01-06 NOTE — Telephone Encounter (Signed)
Left patient vm to give me a call back in regard to complaint.

## 2022-01-11 NOTE — Telephone Encounter (Signed)
I have spoken with patient in regard to BBB complaint.  Patient wanted fee to be refunded.  This had been voided and a check will be cut back to patient in regard.  I have asked patient what else I could do for him.  Patient did not request for anything else. I have left patient VM to confirm that fee will be mailed back.  I have also in formed patient to give me a call back for any other concerns.

## 2022-02-12 DIAGNOSIS — J387 Other diseases of larynx: Secondary | ICD-10-CM | POA: Diagnosis not present

## 2022-02-12 DIAGNOSIS — H5712 Ocular pain, left eye: Secondary | ICD-10-CM | POA: Diagnosis not present

## 2022-02-12 DIAGNOSIS — R2231 Localized swelling, mass and lump, right upper limb: Secondary | ICD-10-CM | POA: Diagnosis not present

## 2022-02-12 DIAGNOSIS — I10 Essential (primary) hypertension: Secondary | ICD-10-CM | POA: Diagnosis not present

## 2022-02-12 DIAGNOSIS — R09A2 Foreign body sensation, throat: Secondary | ICD-10-CM | POA: Diagnosis not present

## 2022-02-12 DIAGNOSIS — R2232 Localized swelling, mass and lump, left upper limb: Secondary | ICD-10-CM | POA: Diagnosis not present

## 2022-03-17 DIAGNOSIS — R2231 Localized swelling, mass and lump, right upper limb: Secondary | ICD-10-CM | POA: Diagnosis not present

## 2022-03-18 DIAGNOSIS — R2231 Localized swelling, mass and lump, right upper limb: Secondary | ICD-10-CM | POA: Insufficient documentation

## 2022-04-05 DIAGNOSIS — R2231 Localized swelling, mass and lump, right upper limb: Secondary | ICD-10-CM | POA: Diagnosis not present

## 2022-04-15 ENCOUNTER — Telehealth: Payer: Self-pay | Admitting: Family Medicine

## 2022-04-15 NOTE — Telephone Encounter (Signed)
Left message for patient to call back and schedule Medicare Annual Wellness Visit (AWV).   Please offer to do virtually or by telephone.  Left office number and my jabber [phone].  AWVI eligible as of 12/06/2020  Please schedule at anytime with Nurse Health Advisor.

## 2022-04-20 ENCOUNTER — Telehealth: Payer: Self-pay

## 2022-04-20 NOTE — Telephone Encounter (Signed)
Patient calls nurse line requesting a GI referral.   He reports he has a hx of IBS. He reports he has been eating 2 meals per day and each meal he experiences stomach cramps and diarrhea. He reports most recently he feels "very full" despite eating the 2 small meals.   He denies any blood in stool and denies fevers.   He does report one episode of vomiting recently. Denies any bloody emesis.   He reports he has seen GI providers recently and reports he was told he has GERD. He reports he does not believe this is the case and is requesting a second opinion.   Attempted to schedule an apt with PCP first, however he declined.   Will forward to PCP.

## 2022-04-21 NOTE — Telephone Encounter (Signed)
It looks like he is established at another practice, [place]. He should get a referral from them if indicated.

## 2022-04-22 NOTE — Telephone Encounter (Signed)
I called patient to discuss.   Patient reports he would to continue seeing Dr. Nancy Fetter.   From what I can gather, patient seems frustrated by [facility].  Patient scheduled with Dr. Nancy Fetter to discuss his concerns.

## 2022-05-02 NOTE — Patient Instructions (Signed)
Health Maintenance, Male Adopting a healthy lifestyle and getting preventive care are important in promoting health and wellness. Ask your health care provider about: The right schedule for you to have regular tests and exams. Things you can do on your own to prevent diseases and keep yourself healthy. What should I know about diet, weight, and exercise? Eat a healthy diet  Eat a diet that includes plenty of vegetables, fruits, low-fat dairy products, and lean protein. Do not eat a lot of foods that are high in solid fats, added sugars, or sodium. Maintain a healthy weight Body mass index (BMI) is a measurement that can be used to identify possible weight problems. It estimates body fat based on height and weight. Your health care provider can help determine your BMI and help you achieve or maintain a healthy weight. Get regular exercise Get regular exercise. This is one of the most important things you can do for your health. Most adults should: Exercise for at least 150 minutes each week. The exercise should increase your heart rate and make you sweat (moderate-intensity exercise). Do strengthening exercises at least twice a week. This is in addition to the moderate-intensity exercise. Spend less time sitting. Even light physical activity can be beneficial. Watch cholesterol and blood lipids Have your blood tested for lipids and cholesterol at 55 years of age, then have this test every 5 years. You may need to have your cholesterol levels checked more often if: Your lipid or cholesterol levels are high. You are older than 55 years of age. You are at high risk for heart disease. What should I know about cancer screening? Many types of cancers can be detected early and may often be prevented. Depending on your health history and family history, you may need to have cancer screening at various ages. This may include screening for: Colorectal cancer. Prostate cancer. Skin cancer. Lung  cancer. What should I know about heart disease, diabetes, and high blood pressure? Blood pressure and heart disease High blood pressure causes heart disease and increases the risk of stroke. This is more likely to develop in people who have high blood pressure readings or are overweight. Talk with your health care provider about your target blood pressure readings. Have your blood pressure checked: Every 3-5 years if you are 18-39 years of age. Every year if you are 40 years old or older. If you are between the ages of 65 and 75 and are a current or former smoker, ask your health care provider if you should have a one-time screening for abdominal aortic aneurysm (AAA). Diabetes Have regular diabetes screenings. This checks your fasting blood sugar level. Have the screening done: Once every three years after age 45 if you are at a normal weight and have a low risk for diabetes. More often and at a younger age if you are overweight or have a high risk for diabetes. What should I know about preventing infection? Hepatitis B If you have a higher risk for hepatitis B, you should be screened for this virus. Talk with your health care provider to find out if you are at risk for hepatitis B infection. Hepatitis C Blood testing is recommended for: Everyone born from 1945 through 1965. Anyone with known risk factors for hepatitis C. Sexually transmitted infections (STIs) You should be screened each year for STIs, including gonorrhea and chlamydia, if: You are sexually active and are younger than 55 years of age. You are older than 55 years of age and your   health care provider tells you that you are at risk for this type of infection. Your sexual activity has changed since you were last screened, and you are at increased risk for chlamydia or gonorrhea. Ask your health care provider if you are at risk. Ask your health care provider about whether you are at high risk for HIV. Your health care provider  may recommend a prescription medicine to help prevent HIV infection. If you choose to take medicine to prevent HIV, you should first get tested for HIV. You should then be tested every 3 months for as long as you are taking the medicine. Follow these instructions at home: Alcohol use Do not drink alcohol if your health care provider tells you not to drink. If you drink alcohol: Limit how much you have to 0-2 drinks a day. Know how much alcohol is in your drink. In the [place] one drink equals one 12 oz bottle of beer (355 mL), one 5 oz glass of wine (148 mL), or one 1 oz glass of hard liquor (44 mL). Lifestyle Do not use any products that contain nicotine or tobacco. These products include cigarettes, chewing tobacco, and vaping devices, such as e-cigarettes. If you need help quitting, ask your health care provider. Do not use street drugs. Do not share needles. Ask your health care provider for help if you need support or information about quitting drugs. General instructions Schedule regular health, dental, and eye exams. Stay current with your vaccines. Tell your health care provider if: You often feel depressed. You have ever been abused or do not feel safe at home. Summary Adopting a healthy lifestyle and getting preventive care are important in promoting health and wellness. Follow your health care provider's instructions about healthy diet, exercising, and getting tested or screened for diseases. Follow your health care provider's instructions on monitoring your cholesterol and blood pressure. This information is not intended to replace advice given to you by your health care provider. Make sure you discuss any questions you have with your health care provider. Document Revised: 07/14/2020 Document Reviewed: 07/14/2020 Elsevier Patient Education  2023 [employer]  

## 2022-05-02 NOTE — Progress Notes (Unsigned)
I connected with  Alexander Black on 05/03/2022 by a audio enabled telemedicine application and verified that I am speaking with the correct person using two identifiers.  Patient Location: Home  Provider Location: Home Office  I discussed the limitations of evaluation and management by telemedicine. The patient expressed understanding and agreed to proceed.  Subjective:   Alexander Black is a 57 y.o. male who presents for an Initial Medicare Annual Wellness Visit.  Review of Systems    Per HPI unless specifically indicated below.           Objective:       10/27/2021    4:16 PM 09/29/2021    3:15 PM 01/14/2021    3:20 PM  Vitals with BMI  Height '5\' 11"'$  '5\' 11"'$  '5\' 11"'$   Weight 290 lbs 293 lbs 6 oz 337 lbs 8 oz  BMI 40.46 [id number]  Systolic [id number]  Diastolic 80 76 94  Pulse 82 74 88    There were no vitals filed for this visit. There is no height or weight on file to calculate BMI.     09/29/2021    3:14 PM 10/14/2020    9:09 AM 07/25/2020    3:21 AM 07/15/2020    4:34 PM 05/21/2019    1:26 PM  Advanced Directives  Does Patient Have a Medical Advance Directive? No No No No No  Would patient like information on creating a medical advance directive? No - Patient declined No - Patient declined  No - Patient declined     Current Medications (verified) Outpatient Encounter Medications as of 05/03/2022  Medication Sig   metoCLOPramide (REGLAN) 10 MG tablet Take 1 tablet (10 mg total) by mouth at bedtime.   pantoprazole (PROTONIX) 40 MG tablet Take 40 mg by mouth daily.   No facility-administered encounter medications on file as of 05/03/2022.    Allergies (verified) Albuterol   History: Past Medical History:  Diagnosis Date   GERD (gastroesophageal reflux disease)    Past Surgical History:  Procedure Laterality Date   ESOPHAGEAL MANOMETRY N/A 11/26/2020   Procedure: ESOPHAGEAL MANOMETRY (EM);  Surgeon: Daryel November, MD;  Location: [facility];   Service: Gastroenterology;  Laterality: N/A;   ESOPHAGOGASTRODUODENOSCOPY (EGD) WITH PROPOFOL N/A 10/14/2020   Procedure: ESOPHAGOGASTRODUODENOSCOPY (EGD) WITH PROPOFOL;  Surgeon: Daryel November, MD;  Location: [facility];  Service: Gastroenterology;  Laterality: N/A;   LEG SURGERY     No family history on file. Social History   Socioeconomic History   Marital status: Divorced    Spouse name: Not on file   Number of children: Not on file   Years of education: Not on file   Highest education level: Not on file  Occupational History   Not on file  Tobacco Use   Smoking status: Some Days   Smokeless tobacco: Never   Tobacco comments:    pt reports smoking marijuana occaisionally in [place] for orthopedic pain  Vaping Use   Vaping Use: Never used  Substance and Sexual Activity   Alcohol use: Not Currently   Drug use: Not Currently   Sexual activity: Not Currently  Other Topics Concern   Not on file  Social History Narrative   Not on file   Social Determinants of Health   Financial Resource Strain: Not on file  Food Insecurity: Not on file  Transportation Needs: Not on file  Physical Activity: Not on file  Stress: Not on file  Social Connections: Not  on file    Tobacco Counseling Ready to quit: Not Answered Counseling given: Not Answered Tobacco comments: pt reports smoking marijuana occaisionally in [place] for orthopedic pain   Clinical Intake:                 Diabetic?No         Activities of Daily Living     No data to display          Patient Care Team: Zola Button, MD as PCP - General (Family Medicine)  Indicate any recent Medical Services you may have received from other than [facility] providers in the past year (date may be approximate).     Assessment:   This is a routine wellness examination for [name].   Hearing/Vision screen Denies any hearing issues. Denies any change to her vision. Annual Eye Exam.   Dietary issues and exercise  activities discussed:     Goals Addressed   None    Depression Screen    09/29/2021    3:29 PM  PHQ 2/9 Scores  PHQ - 2 Score 1  PHQ- 9 Score 1    Fall Risk    09/29/2021    3:15 PM  Fall Risk   Falls in the past year? 1  Number falls in past yr: 0  Injury with Fall? 1    FALL RISK PREVENTION PERTAINING TO THE HOME:  Any stairs in or around the home? {YES/NO:21197} If so, are there any without handrails? {YES/NO:21197} Home free of loose throw rugs in walkways, pet beds, electrical cords, etc? {YES/NO:21197} Adequate lighting in your home to reduce risk of falls? {YES/NO:21197}  ASSISTIVE DEVICES UTILIZED TO PREVENT FALLS:  Life alert? {YES/NO:21197} Use of a cane, walker or w/c? {YES/NO:21197} Grab bars in the bathroom? {YES/NO:21197} Shower chair or bench in shower? {YES/NO:21197} Elevated toilet seat or a handicapped toilet? {YES/NO:21197}  TIMED UP AND GO:  Was the test performed?Unable to perform, virtual appointment   Cognitive Function:        Immunizations Immunization History  Administered Date(s) Administered   Influenza,inj,Quad PF,6+ Mos 12/10/2021   Influenza-Unspecified 12/07/2018, 12/18/2019   PFIZER Comirnaty(Gray Top)Covid-19 Tri-Sucrose Vaccine 08/01/2020   PFIZER(Purple Top)SARS-COV-2 Vaccination 06/19/2019, 07/10/2019   Pfizer Covid-19 Vaccine Bivalent Booster 16yr & up 12/12/2020    TDAP status: Due, Education has been provided regarding the importance of this vaccine. Advised may receive this vaccine at local pharmacy or Health Dept. Aware to provide a copy of the vaccination record if obtained from local pharmacy or Health Dept. Verbalized acceptance and understanding.  Flu Vaccine status: Up to date    Covid-19 vaccine status: Information provided on how to obtain vaccines.   Qualifies for Shingles Vaccine? Yes   Zostavax completed No   Shingrix Completed?: No.    Education has been provided regarding the importance of this  vaccine. Patient has been advised to call insurance company to determine out of pocket expense if they have not yet received this vaccine. Advised may also receive vaccine at local pharmacy or Health Dept. Verbalized acceptance and understanding.  Screening Tests Health Maintenance  Topic Date Due   DTaP/Tdap/Td (1 - Tdap) Never done   COLONOSCOPY (Pts 45-432yrInsurance coverage will need to be confirmed)  Never done   Zoster Vaccines- Shingrix (1 of 2) Never done   Medicare Annual Wellness (AWV)  12/17/2020   COVID-19 Vaccine (5 - 2023-24 season) 11/06/2021   INFLUENZA VACCINE  Completed   Hepatitis C Screening  Completed  HIV Screening  Completed   HPV VACCINES  Aged Out    Health Maintenance  Health Maintenance Due  Topic Date Due   DTaP/Tdap/Td (1 - Tdap) Never done   COLONOSCOPY (Pts 45-23yr Insurance coverage will need to be confirmed)  Never done   Zoster Vaccines- Shingrix (1 of 2) Never done   Medicare Annual Wellness (AWV)  12/17/2020   COVID-19 Vaccine (5 - 2023-24 season) 11/06/2021    Colon Cancer Screening: declined colonosopym   Lung Cancer Screening: (Low Dose CT Chest recommended if Age 55-80years, 30 pack-year currently smoking OR have quit w/in 15years.) does not qualify.   Lung Cancer Screening Referral: not applicable   Additional Screening:  Hepatitis C Screening: does qualify; Completed 09/30/2021  Vision Screening: Recommended annual ophthalmology exams for early detection of glaucoma and other disorders of the eye. Is the patient up to date with their annual eye exam?  {YES/NO:21197} Who is the provider or what is the name of the office in which the patient attends annual eye exams? *** If pt is not established with a provider, would they like to be referred to a provider to establish care? {YES/NO:21197}.   Dental Screening: Recommended annual dental exams for proper oral hygiene  Community Resource Referral / Chronic Care Management: CRR  required this visit?  No   CCM required this visit?  No      Plan:     I have personally reviewed and noted the following in the patient's chart:   Medical and social history Use of alcohol, tobacco or illicit drugs  Current medications and supplements including opioid prescriptions. Patient is not currently taking opioid prescriptions. Functional ability and status Nutritional status Physical activity Advanced directives List of other physicians Hospitalizations, surgeries, and ER visits in previous 12 months Vitals Screenings to include cognitive, depression, and falls Referrals and appointments  In addition, I have reviewed and discussed with patient certain preventive protocols, quality metrics, and best practice recommendations. A written personalized care plan for preventive services as well as general preventive health recommendations were provided to patient.     Mr. EKarow, Thank you for taking time to come for your Medicare Wellness Visit. I appreciate your ongoing commitment to your health goals. Please review the following plan we discussed and let me know if I can assist you in the future.   These are the goals we discussed:  Goals   None     This is a list of the screening recommended for you and due dates:  Health Maintenance  Topic Date Due   DTaP/Tdap/Td vaccine (1 - Tdap) Never done   Colon Cancer Screening  Never done   Zoster (Shingles) Vaccine (1 of 2) Never done   COVID-19 Vaccine (5 - 2023-24 season) 11/06/2021   Medicare Annual Wellness Visit  05/04/2023   Flu Shot  Completed   Hepatitis C Screening: USPSTF Recommendation to screen - Ages 18-79 yo.  Completed   HIV Screening  Completed   HPV Vaccine  [place]ged [address] [place]  05/03/2022  Nurse Notes: Approximately 30 minute Non-Face -To-Face Medicare Wellness Visit

## 2022-05-03 ENCOUNTER — Ambulatory Visit (INDEPENDENT_AMBULATORY_CARE_PROVIDER_SITE_OTHER): Payer: Medicare HMO

## 2022-05-03 DIAGNOSIS — Z0000 Encounter for general adult medical examination without abnormal findings: Secondary | ICD-10-CM | POA: Diagnosis not present

## 2022-05-04 ENCOUNTER — Encounter: Payer: Self-pay | Admitting: Family Medicine

## 2022-05-04 ENCOUNTER — Ambulatory Visit (INDEPENDENT_AMBULATORY_CARE_PROVIDER_SITE_OTHER): Payer: Medicare HMO | Admitting: Family Medicine

## 2022-05-04 VITALS — BP 110/80 | HR 72 | Ht 71.0 in | Wt 260.0 lb

## 2022-05-04 DIAGNOSIS — R21 Rash and other nonspecific skin eruption: Secondary | ICD-10-CM | POA: Diagnosis not present

## 2022-05-04 DIAGNOSIS — R1310 Dysphagia, unspecified: Secondary | ICD-10-CM

## 2022-05-04 DIAGNOSIS — M6740 Ganglion, unspecified site: Secondary | ICD-10-CM

## 2022-05-04 MED ORDER — CLOTRIMAZOLE 1 % EX CREA: 1.0000 | TOPICAL_CREAM | Freq: Two times a day (BID) | CUTANEOUS | 0 refills | Status: AC

## 2022-05-04 NOTE — Assessment & Plan Note (Signed)
Has been worked up with EGD and an endoscopy in the past without any concerning findings.  Seems to be improving with weight loss.  I do not think this needs further workup.

## 2022-05-04 NOTE — Patient Instructions (Addendum)
It was nice seeing you today!  Try clotrimazole cream twice a day for 2 weeks to affected areas.  I am referring you to sports medicine for the bump on your hand which is known as a ganglion cyst.  Keep up with the weight loss!  Stay well, Zola Button, MD [place] [phone]  --  Make sure to check out at the front desk before you leave today.  Please arrive at least 15 minutes prior to your scheduled appointments.  If you had blood work today, I will send you a MyChart message or a letter if results are normal. Otherwise, I will give you a call.  If you had a referral placed, they will call you to set up an appointment. Please give [place] a call if you don't hear back in the next 2 weeks.  If you need additional refills before your next appointment, please call your pharmacy first.

## 2022-05-04 NOTE — Progress Notes (Signed)
SUBJECTIVE:   CHIEF COMPLAINT / HPI:  Chief Complaint  Patient presents with   Gastroesophageal Reflux    Is not sure it is this because there is no burning   knot in right hand    Rash on armpits, bilateral and abdomen for about 2 years Not itchy painful Was darker, has been getting lighter Not getting bigger  Still having episodic spit up episodes a few times a day but reports overall improving. He has cut back from 2 meals a day to 1 meal a day. Working on weight loss. He has been spitting up less with weight loss efforts. He stopped taking all of his medications including pantoprazole.  Bump on his right third finger present for about 2 years. It is irritating. He did see orthopedics, he got an MRI which showed a ganglion cyst.  They offered surgery but patient declined.  He wants to have a steroid injection which apparently the orthopedic specialist was not willing to do.  PERTINENT  PMH / PSH: GERD  Patient Care Team: Zola Button, MD as PCP - General (Family Medicine)   OBJECTIVE:   BP 110/80   Pulse 72   Ht '5\' 11"'$  (1.803 m)   Wt 260 lb (117.9 kg)   SpO2 98%   BMI 36.26 kg/m   Physical Exam Constitutional:      General: He is not in acute distress.    Appearance: He is obese.  Cardiovascular:     Rate and Rhythm: Normal rate and regular rhythm.  Pulmonary:     Effort: Pulmonary effort is normal. No respiratory distress.     Breath sounds: Normal breath sounds.  Musculoskeletal:     Comments: Subcentimeter mobile nodule felt in the proximal phalanx of the right third finger without overlying skin changes.  Minor tenderness to palpation.  Skin:    Comments: Scattered hyperpigmented somewhat linear slightly raised plaques throughout bilateral axillary regions.  Right lower abdomen there is a approximately 3 cm well-demarcated hyperpigmented scaly slightly raised plaque.  Neurological:     Mental Status: He is alert.         05/04/2022    4:14 PM   Depression screen PHQ 2/9  Decreased Interest 2  Down, Depressed, Hopeless 0  PHQ - 2 Score 2  Altered sleeping 0  Tired, decreased energy 2  Change in appetite 0  Feeling bad or failure about yourself  0  Trouble concentrating 0  Moving slowly or fidgety/restless 0  Suicidal thoughts 0  PHQ-9 Score 4     Wt Readings from Last 3 Encounters:  05/04/22 260 lb (117.9 kg)  10/27/21 290 lb (131.5 kg)  09/29/21 293 lb 6 oz (133.1 kg)        ASSESSMENT/PLAN:   Dysphagia Has been worked up with EGD and an endoscopy in the past without any concerning findings.  Seems to be improving with weight loss.  I do not think this needs further workup.   1. Ganglion cyst Affecting right third middle finger proximal phalanx. - Ambulatory referral to Sports Medicine for possible aspiration and/or corticosteroid injection  2. Rash Present for 2 years appearance clinically is possible tinea corporis or tinea versicolor or pityriasis rosacea.  Treat empirically with antifungal.  If no improvement at follow-up, consider topical corticosteroids. - clotrimazole (LOTRIMIN) 1 % cream; Apply 1 Application topically 2 (two) times daily.  Dispense: 30 g; Refill: 0   Return in about 6 months (around 11/02/2022) for follow-up GERD.  Zola Button, MD [place]

## 2022-05-10 ENCOUNTER — Ambulatory Visit: Payer: Self-pay

## 2022-05-10 ENCOUNTER — Encounter: Payer: Self-pay | Admitting: Family Medicine

## 2022-05-10 ENCOUNTER — Ambulatory Visit (INDEPENDENT_AMBULATORY_CARE_PROVIDER_SITE_OTHER): Payer: Medicare HMO | Admitting: Family Medicine

## 2022-05-10 VITALS — BP 116/62 | Ht 71.0 in | Wt 252.0 lb

## 2022-05-10 DIAGNOSIS — M79644 Pain in right finger(s): Secondary | ICD-10-CM

## 2022-05-10 DIAGNOSIS — M5416 Radiculopathy, lumbar region: Secondary | ICD-10-CM

## 2022-05-10 MED ORDER — PREDNISONE 10 MG PO TABS: ORAL_TABLET | ORAL | 0 refills | Status: AC

## 2022-05-10 NOTE — Patient Instructions (Signed)
We aspirated the cyst in your finger. If this comes back we can consider this again in a month. Try not to rub this area. Ice if needed 15 minutes at a time. Prednisone dose pack for your back x 6 days - don't take aleve or ibuprofen while on this. Follow up with [place] in 1 month but you can make an appointment sooner if needed for your shoulder.

## 2022-05-11 ENCOUNTER — Encounter: Payer: Self-pay | Admitting: Family Medicine

## 2022-05-11 NOTE — Progress Notes (Signed)
PCP: Zola Button, MD  Subjective:   HPI: Patient is a 57 y.o. male here for right hand nodule, low back pain.  Patient reports having had a knot/bump in his right middle finger on volar side for a couple years. No redness, fever, drainage. Had MRI of this showing it to be a cyst without concerning features. He would like this aspirated or injected. Also reports having had low back pain for years but 2 days ago began to have acute pain when he woke up in low back radiating down right leg. Has not tried any medication for this - in past did muscle relaxants. No bowel/bladder dysfunction.  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 30 g 0   No current facility-administered medications on file prior to visit.    Past Surgical History:  Procedure Laterality Date   ESOPHAGEAL MANOMETRY N/A 11/26/2020   Procedure: ESOPHAGEAL MANOMETRY (EM);  Surgeon: Daryel November, MD;  [facility];  Service: Gastroenterology;  Laterality: N/A;   ESOPHAGOGASTRODUODENOSCOPY (EGD) WITH PROPOFOL N/A 10/14/2020   Procedure: ESOPHAGOGASTRODUODENOSCOPY (EGD) WITH PROPOFOL;  Surgeon: Daryel November, MD;  [facility]: [facility];  Service: Gastroenterology;  Laterality: N/A;   LEG SURGERY      Allergies  Allergen Reactions   Albuterol Shortness Of Breath    BP 116/62   Ht '5\' 11"'$  (1.803 m)   Wt 252 lb (114.3 kg)   BMI 35.15 kg/m       No  data to display              No data to display              Objective:  Physical Exam:  Gen: NAD, comfortable in exam room  Right hand: Small mobile nodule radial side volar 3rd digit - nontender to palpation. FROM with 5/5 strength digits. NVI distally.  Back: No gross deformity, scoliosis. No paraspinal TTP.  No midline or bony TTP. FROM. Strength LEs 5/5 all muscle groups except 2/5 left ankle plantar/dorsalflexion, 3/5 knee flexion and extension though these are not new - noted since childhood.   Trace MSRs in patellar and achilles tendons, equal bilaterally. Negative SLRs. Sensation intact to light touch bilaterally.   Assessment & Plan:  1. Right 3rd digit ganglion cyst - discussed options and patient chose to have this aspirated which was performed today.  F/u in 1 month.  After informed written consent timeout was performed.  Patient was seated on exam table.  Area overlying right volar 3rd digit prepped with alcohol swab proximally then utilizing ultrasound guidance following 35m lidocaine for local anesthesia, patient's ganglion cyst was aspirated.  Small amount of gelatinous fluid aspirated.  Patient tolerated procedure well without immediate complications.  2. Low back pain radiating to right lower extremity - consistent with lumbar radiculopathy.  Steroid dose pack.  Home exercises.  Consider physical therapy, MRI if not improving.

## 2022-06-07 ENCOUNTER — Ambulatory Visit (INDEPENDENT_AMBULATORY_CARE_PROVIDER_SITE_OTHER): Payer: Medicare HMO | Admitting: Family Medicine

## 2022-06-07 ENCOUNTER — Ambulatory Visit
Admission: RE | Admit: 2022-06-07 | Discharge: 2022-06-07 | Disposition: A | Discharge status: Home / Self Care | Payer: Medicare HMO | Source: Ambulatory Visit | Attending: Family Medicine | Admitting: Family Medicine

## 2022-06-07 ENCOUNTER — Ambulatory Visit: Payer: Self-pay

## 2022-06-07 VITALS — BP 110/70 | Ht 69.0 in | Wt 249.0 lb

## 2022-06-07 DIAGNOSIS — M25512 Pain in left shoulder: Secondary | ICD-10-CM

## 2022-06-07 DIAGNOSIS — M19012 Primary osteoarthritis, left shoulder: Secondary | ICD-10-CM | POA: Diagnosis not present

## 2022-06-07 MED ORDER — DICLOFENAC SODIUM 75 MG PO TBEC: 75.0000 mg | DELAYED_RELEASE_TABLET | Freq: Two times a day (BID) | ORAL | 1 refills | Status: AC

## 2022-06-07 NOTE — Patient Instructions (Addendum)
You have a tear in one of your rotator cuff muscles. We will go ahead with an MRI to see if this is reparable. Consider an injection though this would only help with pain relief and wouldn't fix the tear. Take diclofenac 75mg  twice a day with food for pain and inflammation. Don't take aleve or ibuprofen while on this medicine. We can consider aspirating the cyst again of your finger though concerned this will re-collect. Follow up with me a couple days after the MRI to go over results and next steps.

## 2022-06-08 ENCOUNTER — Encounter: Payer: Self-pay | Admitting: Family Medicine

## 2022-06-08 NOTE — Progress Notes (Signed)
PCP: Zola Button, MD  Subjective:   HPI: Patient is a 57 y.o. male here for left shoulder pain.  Patient reports having had left shoulder pain for about 3 months. No acute injury or trauma. Pain felt mostly anteriorly. + night pain - cannot lie on left side. Worse reaching overhead and out in front. Reports the cyst in his hand improved, pain less after aspiration but it recollected.  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 30 g 0   predniSONE (DELTASONE) 10 MG tablet 6 tabs po day 1, 5 tabs po day 2, 4 tabs po day 3, 3 tabs po day 4, 2 tabs po day 5, 1 tab po day 6 21 tablet 0   No current facility-administered medications on file prior to visit.    Past Surgical History:  Procedure Laterality Date   ESOPHAGEAL MANOMETRY N/A 11/26/2020   Procedure: ESOPHAGEAL MANOMETRY (EM);  Surgeon: Daryel November, MD;  [facility];  Service: Gastroenterology;  Laterality: N/A;   ESOPHAGOGASTRODUODENOSCOPY (EGD) WITH PROPOFOL N/A 10/14/2020   Procedure: ESOPHAGOGASTRODUODENOSCOPY (EGD) WITH PROPOFOL;  Surgeon: Daryel November, MD;  [facility];  Service: Gastroenterology;  Laterality: N/A;   LEG SURGERY      Allergies  Allergen Reactions   Albuterol Shortness Of Breath    BP 110/70   Ht 5\' 9"  (1.753 m)   Wt 249 lb (112.9 kg)   BMI 36.77 kg/m       No data to display              No data to display               Objective:  Physical Exam:  Gen: NAD, comfortable in exam room  Left shoulder: No swelling, ecchymoses.  No gross deformity. Mild TTP over biceps tendon.  No other tenderness. FROM with painful arc. Positive Hawkins, Neers. Negative Yergasons. Strength 5/5 with resisted internal/external rotation.  4/5 empty can. NV intact distally.  Complete MSK u/s left shoulder: Biceps tendon: heterogeneous signal within tendon consistent with tendinopathy.  Moderate tenosynovitis Pec major tendon: intact Subscapularis: no visible tears or dynamic impingement AC joint: moderate arthropathy with effusion Infraspinatus: no visible tears. Supraspinatus: full thickness partial width tear with over 1cm of retraction anterior supraspinatus.  Hyperechoic change within retracted tendon Posterior glenohumeral joint: glenohumeral arthropathy with loss of shoulder rounding.  No effusion.  Impression: full thickness partial width tear of supraspinatus; biceps tendinopathy with tenosynovitis; moderate AC arthropathy with effusion; glenohumeral arthropathy   Assessment & Plan:  1. Left shoulder pain - no acute injury but pain for 3 months.  Noted full thickness tear of supraspinatus on ultrasound - will proceed with MRI to confirm and assess health of tendon in preparation for possible repair.  Discussed pros and cons, risks/benefits should he decide on repair vs conservative treatment.  Icing, diclofenac.  2. Right 3rd digit cyst - recollected after aspiration - can consider repeating this in future but concerned about  high likelihood of recollecting again and need for surgical excision.

## 2022-06-18 ENCOUNTER — Encounter: Payer: Self-pay | Admitting: Family Medicine

## 2022-06-21 ENCOUNTER — Encounter: Payer: Self-pay | Admitting: Family Medicine

## 2022-06-28 ENCOUNTER — Ambulatory Visit
Admission: RE | Admit: 2022-06-28 | Discharge: 2022-06-28 | Disposition: A | Discharge status: Home / Self Care | Payer: Medicare HMO | Source: Ambulatory Visit | Attending: Family Medicine | Admitting: Family Medicine

## 2022-06-28 DIAGNOSIS — M25512 Pain in left shoulder: Secondary | ICD-10-CM

## 2022-06-28 DIAGNOSIS — M25412 Effusion, left shoulder: Secondary | ICD-10-CM | POA: Diagnosis not present

## 2022-06-28 DIAGNOSIS — M75122 Complete rotator cuff tear or rupture of left shoulder, not specified as traumatic: Secondary | ICD-10-CM | POA: Diagnosis not present

## 2022-06-28 DIAGNOSIS — M19012 Primary osteoarthritis, left shoulder: Secondary | ICD-10-CM | POA: Diagnosis not present

## 2022-06-30 ENCOUNTER — Ambulatory Visit (INDEPENDENT_AMBULATORY_CARE_PROVIDER_SITE_OTHER): Payer: Medicare HMO | Admitting: Family Medicine

## 2022-06-30 VITALS — BP 110/76

## 2022-06-30 DIAGNOSIS — M25512 Pain in left shoulder: Secondary | ICD-10-CM

## 2022-06-30 NOTE — Patient Instructions (Signed)
Start physical therapy for your back and your left shoulder. You have a partial width full thickness tear of your supraspinatus (rotator cuff) and partial tear of your biceps tendon as your major findings that fit with the pain and weakness you're having in your shoulder. Continue diclofenac twice a day with food for pain and inflammation. Don't take naproxen while you're on this. Ice 15 minutes at a time only 3-4 times a day. Ok to take tylenol as well with the diclofenac. Ok to use topical biofreeze also up to 4 times a day. Follow up with me in 1 month to 6 weeks.  If not improving with the shoulder I would recommend consulting with the surgeon.

## 2022-07-01 ENCOUNTER — Encounter: Payer: Self-pay | Admitting: Family Medicine

## 2022-07-01 NOTE — Progress Notes (Signed)
Patient returned today to go over MRI results of his shoulder.  These confirmed full thickness partial width tear of his supraspinatus with 1.3cm retraction.  Noted tendinopathy and partial thickness tearing of subscapularis, partial tearing long head biceps with mild displacement.  Underlying moderate glenohumeral arthropathy.  Unfortunately I do not expect complete recovery without surgical intervention and encouraged referral to talk about those options.  Because of his prior experiences with other surgeries he is adamant about trying conservative treatment first with physical therapy.  He understands risk of chronic pain, permanent weakness, if tear is currently reparable and this is delayed, it may become irreparable.  He will follow up with [place] in 1 month.

## 2022-07-08 DIAGNOSIS — M545 Low back pain, unspecified: Secondary | ICD-10-CM | POA: Diagnosis not present

## 2022-07-08 DIAGNOSIS — M79604 Pain in right leg: Secondary | ICD-10-CM | POA: Diagnosis not present

## 2022-07-08 DIAGNOSIS — M79661 Pain in right lower leg: Secondary | ICD-10-CM | POA: Diagnosis not present

## 2022-07-08 DIAGNOSIS — M25561 Pain in right knee: Secondary | ICD-10-CM | POA: Diagnosis not present

## 2022-07-09 DIAGNOSIS — M4316 Spondylolisthesis, lumbar region: Secondary | ICD-10-CM | POA: Diagnosis not present

## 2022-07-09 DIAGNOSIS — S46012A Strain of muscle(s) and tendon(s) of the rotator cuff of left shoulder, initial encounter: Secondary | ICD-10-CM | POA: Diagnosis not present

## 2022-07-13 DIAGNOSIS — M79661 Pain in right lower leg: Secondary | ICD-10-CM | POA: Diagnosis not present

## 2022-07-13 DIAGNOSIS — M25561 Pain in right knee: Secondary | ICD-10-CM | POA: Diagnosis not present

## 2022-07-13 DIAGNOSIS — M79604 Pain in right leg: Secondary | ICD-10-CM | POA: Diagnosis not present

## 2022-07-13 DIAGNOSIS — M545 Low back pain, unspecified: Secondary | ICD-10-CM | POA: Diagnosis not present

## 2022-07-20 DIAGNOSIS — M79661 Pain in right lower leg: Secondary | ICD-10-CM | POA: Diagnosis not present

## 2022-07-20 DIAGNOSIS — M79604 Pain in right leg: Secondary | ICD-10-CM | POA: Diagnosis not present

## 2022-07-20 DIAGNOSIS — M25561 Pain in right knee: Secondary | ICD-10-CM | POA: Diagnosis not present

## 2022-07-20 DIAGNOSIS — M545 Low back pain, unspecified: Secondary | ICD-10-CM | POA: Diagnosis not present

## 2022-07-22 DIAGNOSIS — M79604 Pain in right leg: Secondary | ICD-10-CM | POA: Diagnosis not present

## 2022-07-22 DIAGNOSIS — M545 Low back pain, unspecified: Secondary | ICD-10-CM | POA: Diagnosis not present

## 2022-07-22 DIAGNOSIS — M79661 Pain in right lower leg: Secondary | ICD-10-CM | POA: Diagnosis not present

## 2022-07-22 DIAGNOSIS — M25561 Pain in right knee: Secondary | ICD-10-CM | POA: Diagnosis not present

## 2022-07-26 DIAGNOSIS — M545 Low back pain, unspecified: Secondary | ICD-10-CM | POA: Diagnosis not present

## 2022-07-26 DIAGNOSIS — M79604 Pain in right leg: Secondary | ICD-10-CM | POA: Diagnosis not present

## 2022-07-26 DIAGNOSIS — M25561 Pain in right knee: Secondary | ICD-10-CM | POA: Diagnosis not present

## 2022-07-26 DIAGNOSIS — M79661 Pain in right lower leg: Secondary | ICD-10-CM | POA: Diagnosis not present

## 2022-07-29 DIAGNOSIS — M545 Low back pain, unspecified: Secondary | ICD-10-CM | POA: Diagnosis not present

## 2022-07-29 DIAGNOSIS — M79604 Pain in right leg: Secondary | ICD-10-CM | POA: Diagnosis not present

## 2022-07-29 DIAGNOSIS — M79661 Pain in right lower leg: Secondary | ICD-10-CM | POA: Diagnosis not present

## 2022-07-29 DIAGNOSIS — M25561 Pain in right knee: Secondary | ICD-10-CM | POA: Diagnosis not present

## 2022-08-03 DIAGNOSIS — M79661 Pain in right lower leg: Secondary | ICD-10-CM | POA: Diagnosis not present

## 2022-08-03 DIAGNOSIS — M545 Low back pain, unspecified: Secondary | ICD-10-CM | POA: Diagnosis not present

## 2022-08-03 DIAGNOSIS — M79604 Pain in right leg: Secondary | ICD-10-CM | POA: Diagnosis not present

## 2022-08-03 DIAGNOSIS — M25561 Pain in right knee: Secondary | ICD-10-CM | POA: Diagnosis not present

## 2022-08-05 DIAGNOSIS — M79661 Pain in right lower leg: Secondary | ICD-10-CM | POA: Diagnosis not present

## 2022-08-05 DIAGNOSIS — M25561 Pain in right knee: Secondary | ICD-10-CM | POA: Diagnosis not present

## 2022-08-05 DIAGNOSIS — M79604 Pain in right leg: Secondary | ICD-10-CM | POA: Diagnosis not present

## 2022-08-05 DIAGNOSIS — M545 Low back pain, unspecified: Secondary | ICD-10-CM | POA: Diagnosis not present

## 2022-08-09 DIAGNOSIS — M545 Low back pain, unspecified: Secondary | ICD-10-CM | POA: Diagnosis not present

## 2022-08-09 DIAGNOSIS — S46012D Strain of muscle(s) and tendon(s) of the rotator cuff of left shoulder, subsequent encounter: Secondary | ICD-10-CM | POA: Diagnosis not present

## 2022-08-10 DIAGNOSIS — M79604 Pain in right leg: Secondary | ICD-10-CM | POA: Diagnosis not present

## 2022-08-10 DIAGNOSIS — M79661 Pain in right lower leg: Secondary | ICD-10-CM | POA: Diagnosis not present

## 2022-08-10 DIAGNOSIS — M545 Low back pain, unspecified: Secondary | ICD-10-CM | POA: Diagnosis not present

## 2022-08-10 DIAGNOSIS — M25561 Pain in right knee: Secondary | ICD-10-CM | POA: Diagnosis not present

## 2022-08-12 DIAGNOSIS — M25561 Pain in right knee: Secondary | ICD-10-CM | POA: Diagnosis not present

## 2022-08-12 DIAGNOSIS — M79661 Pain in right lower leg: Secondary | ICD-10-CM | POA: Diagnosis not present

## 2022-08-12 DIAGNOSIS — M545 Low back pain, unspecified: Secondary | ICD-10-CM | POA: Diagnosis not present

## 2022-08-12 DIAGNOSIS — M79604 Pain in right leg: Secondary | ICD-10-CM | POA: Diagnosis not present

## 2022-08-17 DIAGNOSIS — M545 Low back pain, unspecified: Secondary | ICD-10-CM | POA: Diagnosis not present

## 2022-08-17 DIAGNOSIS — M79604 Pain in right leg: Secondary | ICD-10-CM | POA: Diagnosis not present

## 2022-08-17 DIAGNOSIS — M25561 Pain in right knee: Secondary | ICD-10-CM | POA: Diagnosis not present

## 2022-08-17 DIAGNOSIS — M79661 Pain in right lower leg: Secondary | ICD-10-CM | POA: Diagnosis not present

## 2022-08-18 DIAGNOSIS — M79604 Pain in right leg: Secondary | ICD-10-CM | POA: Diagnosis not present

## 2022-08-18 DIAGNOSIS — M25561 Pain in right knee: Secondary | ICD-10-CM | POA: Diagnosis not present

## 2022-08-18 DIAGNOSIS — M545 Low back pain, unspecified: Secondary | ICD-10-CM | POA: Diagnosis not present

## 2022-08-18 DIAGNOSIS — M79661 Pain in right lower leg: Secondary | ICD-10-CM | POA: Diagnosis not present

## 2022-08-24 DIAGNOSIS — M79661 Pain in right lower leg: Secondary | ICD-10-CM | POA: Diagnosis not present

## 2022-08-24 DIAGNOSIS — M545 Low back pain, unspecified: Secondary | ICD-10-CM | POA: Diagnosis not present

## 2022-08-24 DIAGNOSIS — M25561 Pain in right knee: Secondary | ICD-10-CM | POA: Diagnosis not present

## 2022-08-24 DIAGNOSIS — M79604 Pain in right leg: Secondary | ICD-10-CM | POA: Diagnosis not present

## 2022-08-26 DIAGNOSIS — M79604 Pain in right leg: Secondary | ICD-10-CM | POA: Diagnosis not present

## 2022-08-26 DIAGNOSIS — M79661 Pain in right lower leg: Secondary | ICD-10-CM | POA: Diagnosis not present

## 2022-08-26 DIAGNOSIS — M545 Low back pain, unspecified: Secondary | ICD-10-CM | POA: Diagnosis not present

## 2022-08-26 DIAGNOSIS — M25561 Pain in right knee: Secondary | ICD-10-CM | POA: Diagnosis not present

## 2022-08-31 DIAGNOSIS — M79661 Pain in right lower leg: Secondary | ICD-10-CM | POA: Diagnosis not present

## 2022-08-31 DIAGNOSIS — M545 Low back pain, unspecified: Secondary | ICD-10-CM | POA: Diagnosis not present

## 2022-08-31 DIAGNOSIS — M79604 Pain in right leg: Secondary | ICD-10-CM | POA: Diagnosis not present

## 2022-08-31 DIAGNOSIS — M25561 Pain in right knee: Secondary | ICD-10-CM | POA: Diagnosis not present

## 2022-09-02 DIAGNOSIS — M79661 Pain in right lower leg: Secondary | ICD-10-CM | POA: Diagnosis not present

## 2022-09-02 DIAGNOSIS — M79604 Pain in right leg: Secondary | ICD-10-CM | POA: Diagnosis not present

## 2022-09-02 DIAGNOSIS — M545 Low back pain, unspecified: Secondary | ICD-10-CM | POA: Diagnosis not present

## 2022-09-02 DIAGNOSIS — M25561 Pain in right knee: Secondary | ICD-10-CM | POA: Diagnosis not present

## 2022-09-06 DIAGNOSIS — M545 Low back pain, unspecified: Secondary | ICD-10-CM | POA: Diagnosis not present

## 2022-09-06 DIAGNOSIS — M25561 Pain in right knee: Secondary | ICD-10-CM | POA: Diagnosis not present

## 2022-09-06 DIAGNOSIS — M79604 Pain in right leg: Secondary | ICD-10-CM | POA: Diagnosis not present

## 2022-09-06 DIAGNOSIS — M79661 Pain in right lower leg: Secondary | ICD-10-CM | POA: Diagnosis not present

## 2022-09-08 DIAGNOSIS — M79661 Pain in right lower leg: Secondary | ICD-10-CM | POA: Diagnosis not present

## 2022-09-08 DIAGNOSIS — M79604 Pain in right leg: Secondary | ICD-10-CM | POA: Diagnosis not present

## 2022-09-08 DIAGNOSIS — M25561 Pain in right knee: Secondary | ICD-10-CM | POA: Diagnosis not present

## 2022-09-08 DIAGNOSIS — M545 Low back pain, unspecified: Secondary | ICD-10-CM | POA: Diagnosis not present

## 2022-09-14 DIAGNOSIS — M545 Low back pain, unspecified: Secondary | ICD-10-CM | POA: Diagnosis not present

## 2022-09-14 DIAGNOSIS — M79661 Pain in right lower leg: Secondary | ICD-10-CM | POA: Diagnosis not present

## 2022-09-14 DIAGNOSIS — M25561 Pain in right knee: Secondary | ICD-10-CM | POA: Diagnosis not present

## 2022-09-14 DIAGNOSIS — M79604 Pain in right leg: Secondary | ICD-10-CM | POA: Diagnosis not present

## 2022-09-16 DIAGNOSIS — M25561 Pain in right knee: Secondary | ICD-10-CM | POA: Diagnosis not present

## 2022-09-16 DIAGNOSIS — M79661 Pain in right lower leg: Secondary | ICD-10-CM | POA: Diagnosis not present

## 2022-09-16 DIAGNOSIS — M545 Low back pain, unspecified: Secondary | ICD-10-CM | POA: Diagnosis not present

## 2022-09-16 DIAGNOSIS — M79604 Pain in right leg: Secondary | ICD-10-CM | POA: Diagnosis not present

## 2022-09-21 DIAGNOSIS — M79661 Pain in right lower leg: Secondary | ICD-10-CM | POA: Diagnosis not present

## 2022-09-21 DIAGNOSIS — M25561 Pain in right knee: Secondary | ICD-10-CM | POA: Diagnosis not present

## 2022-09-21 DIAGNOSIS — M545 Low back pain, unspecified: Secondary | ICD-10-CM | POA: Diagnosis not present

## 2022-09-21 DIAGNOSIS — M79604 Pain in right leg: Secondary | ICD-10-CM | POA: Diagnosis not present

## 2022-09-23 DIAGNOSIS — M79661 Pain in right lower leg: Secondary | ICD-10-CM | POA: Diagnosis not present

## 2022-09-23 DIAGNOSIS — M545 Low back pain, unspecified: Secondary | ICD-10-CM | POA: Diagnosis not present

## 2022-09-23 DIAGNOSIS — M79604 Pain in right leg: Secondary | ICD-10-CM | POA: Diagnosis not present

## 2022-09-23 DIAGNOSIS — M25561 Pain in right knee: Secondary | ICD-10-CM | POA: Diagnosis not present

## 2022-09-28 DIAGNOSIS — M545 Low back pain, unspecified: Secondary | ICD-10-CM | POA: Diagnosis not present

## 2022-09-28 DIAGNOSIS — M25561 Pain in right knee: Secondary | ICD-10-CM | POA: Diagnosis not present

## 2022-09-28 DIAGNOSIS — M79604 Pain in right leg: Secondary | ICD-10-CM | POA: Diagnosis not present

## 2022-09-28 DIAGNOSIS — M79661 Pain in right lower leg: Secondary | ICD-10-CM | POA: Diagnosis not present

## 2022-09-30 DIAGNOSIS — M545 Low back pain, unspecified: Secondary | ICD-10-CM | POA: Diagnosis not present

## 2022-09-30 DIAGNOSIS — M25561 Pain in right knee: Secondary | ICD-10-CM | POA: Diagnosis not present

## 2022-09-30 DIAGNOSIS — M79661 Pain in right lower leg: Secondary | ICD-10-CM | POA: Diagnosis not present

## 2022-09-30 DIAGNOSIS — M79604 Pain in right leg: Secondary | ICD-10-CM | POA: Diagnosis not present

## 2022-10-05 DIAGNOSIS — M25561 Pain in right knee: Secondary | ICD-10-CM | POA: Diagnosis not present

## 2022-10-05 DIAGNOSIS — M79661 Pain in right lower leg: Secondary | ICD-10-CM | POA: Diagnosis not present

## 2022-10-05 DIAGNOSIS — M545 Low back pain, unspecified: Secondary | ICD-10-CM | POA: Diagnosis not present

## 2022-10-05 DIAGNOSIS — M79604 Pain in right leg: Secondary | ICD-10-CM | POA: Diagnosis not present

## 2022-10-07 DIAGNOSIS — M545 Low back pain, unspecified: Secondary | ICD-10-CM | POA: Diagnosis not present

## 2022-10-07 DIAGNOSIS — M79661 Pain in right lower leg: Secondary | ICD-10-CM | POA: Diagnosis not present

## 2022-10-07 DIAGNOSIS — M79604 Pain in right leg: Secondary | ICD-10-CM | POA: Diagnosis not present

## 2022-10-07 DIAGNOSIS — M25561 Pain in right knee: Secondary | ICD-10-CM | POA: Diagnosis not present

## 2022-10-12 DIAGNOSIS — M545 Low back pain, unspecified: Secondary | ICD-10-CM | POA: Diagnosis not present

## 2022-10-12 DIAGNOSIS — M79661 Pain in right lower leg: Secondary | ICD-10-CM | POA: Diagnosis not present

## 2022-10-12 DIAGNOSIS — M25561 Pain in right knee: Secondary | ICD-10-CM | POA: Diagnosis not present

## 2022-10-12 DIAGNOSIS — M79604 Pain in right leg: Secondary | ICD-10-CM | POA: Diagnosis not present

## 2022-10-14 DIAGNOSIS — M79661 Pain in right lower leg: Secondary | ICD-10-CM | POA: Diagnosis not present

## 2022-10-14 DIAGNOSIS — M25561 Pain in right knee: Secondary | ICD-10-CM | POA: Diagnosis not present

## 2022-10-14 DIAGNOSIS — M79604 Pain in right leg: Secondary | ICD-10-CM | POA: Diagnosis not present

## 2022-10-14 DIAGNOSIS — M545 Low back pain, unspecified: Secondary | ICD-10-CM | POA: Diagnosis not present

## 2022-10-19 DIAGNOSIS — M79661 Pain in right lower leg: Secondary | ICD-10-CM | POA: Diagnosis not present

## 2022-10-19 DIAGNOSIS — M545 Low back pain, unspecified: Secondary | ICD-10-CM | POA: Diagnosis not present

## 2022-10-19 DIAGNOSIS — M79604 Pain in right leg: Secondary | ICD-10-CM | POA: Diagnosis not present

## 2022-10-19 DIAGNOSIS — M25561 Pain in right knee: Secondary | ICD-10-CM | POA: Diagnosis not present

## 2022-10-21 DIAGNOSIS — M79604 Pain in right leg: Secondary | ICD-10-CM | POA: Diagnosis not present

## 2022-10-21 DIAGNOSIS — M79661 Pain in right lower leg: Secondary | ICD-10-CM | POA: Diagnosis not present

## 2022-10-21 DIAGNOSIS — M25561 Pain in right knee: Secondary | ICD-10-CM | POA: Diagnosis not present

## 2022-10-21 DIAGNOSIS — M545 Low back pain, unspecified: Secondary | ICD-10-CM | POA: Diagnosis not present

## 2022-10-26 DIAGNOSIS — M545 Low back pain, unspecified: Secondary | ICD-10-CM | POA: Diagnosis not present

## 2022-10-26 DIAGNOSIS — M79661 Pain in right lower leg: Secondary | ICD-10-CM | POA: Diagnosis not present

## 2022-10-26 DIAGNOSIS — M79604 Pain in right leg: Secondary | ICD-10-CM | POA: Diagnosis not present

## 2022-10-26 DIAGNOSIS — M25561 Pain in right knee: Secondary | ICD-10-CM | POA: Diagnosis not present

## 2022-10-28 DIAGNOSIS — M545 Low back pain, unspecified: Secondary | ICD-10-CM | POA: Diagnosis not present

## 2022-10-28 DIAGNOSIS — M79661 Pain in right lower leg: Secondary | ICD-10-CM | POA: Diagnosis not present

## 2022-10-28 DIAGNOSIS — M25561 Pain in right knee: Secondary | ICD-10-CM | POA: Diagnosis not present

## 2022-10-28 DIAGNOSIS — M79604 Pain in right leg: Secondary | ICD-10-CM | POA: Diagnosis not present

## 2022-11-02 DIAGNOSIS — M79604 Pain in right leg: Secondary | ICD-10-CM | POA: Diagnosis not present

## 2022-11-02 DIAGNOSIS — M79661 Pain in right lower leg: Secondary | ICD-10-CM | POA: Diagnosis not present

## 2022-11-02 DIAGNOSIS — M25561 Pain in right knee: Secondary | ICD-10-CM | POA: Diagnosis not present

## 2022-11-02 DIAGNOSIS — M545 Low back pain, unspecified: Secondary | ICD-10-CM | POA: Diagnosis not present

## 2022-11-04 DIAGNOSIS — M79661 Pain in right lower leg: Secondary | ICD-10-CM | POA: Diagnosis not present

## 2022-11-04 DIAGNOSIS — M25561 Pain in right knee: Secondary | ICD-10-CM | POA: Diagnosis not present

## 2022-11-04 DIAGNOSIS — M545 Low back pain, unspecified: Secondary | ICD-10-CM | POA: Diagnosis not present

## 2022-11-04 DIAGNOSIS — M79604 Pain in right leg: Secondary | ICD-10-CM | POA: Diagnosis not present

## 2023-09-08 ENCOUNTER — Ambulatory Visit

## 2023-09-08 VITALS — Ht 71.0 in | Wt 255.0 lb

## 2023-09-08 DIAGNOSIS — Z0000 Encounter for general adult medical examination without abnormal findings: Secondary | ICD-10-CM | POA: Diagnosis not present

## 2023-09-08 NOTE — Progress Notes (Signed)
 Because this visit was a virtual/telehealth visit,  certain criteria was not obtained, such a blood pressure, CBG if applicable, and timed get up and go. Any medications not marked as taking were not mentioned during the medication reconciliation part of the visit. Any vitals not documented were not able to be obtained due to this being a telehealth visit or patient was unable to self-report a recent blood pressure reading due to a lack of equipment at home via telehealth. Vitals that have been documented are verbally provided by the patient.   Subjective:   Alexander Black is a 58 y.o. who presents for a Medicare Wellness preventive visit.  As a reminder, Annual Wellness Visits don't include a physical exam, and some assessments may be limited, especially if this visit is performed virtually. We may recommend an in-person follow-up visit with your provider if needed.  Visit Complete: Virtual I connected with  Alexander Black on 09/08/23 by a audio enabled telemedicine application and verified that I am speaking with the correct person using two identifiers.  Patient Location: Home  Provider Location: Office/Clinic  I discussed the limitations of evaluation and management by telemedicine. The patient expressed understanding and agreed to proceed.  Vital Signs: Because this visit was a virtual/telehealth visit, some criteria may be missing or patient reported. Any vitals not documented were not able to be obtained and vitals that have been documented are patient reported.  VideoDeclined- This patient declined Librarian, academic. Therefore the visit was completed with audio only.  Persons Participating in Visit: Patient.  AWV Questionnaire: No: Patient Medicare AWV questionnaire was not completed prior to this visit.  Cardiac Risk Factors include: advanced age (>29men, >22 women);male gender;sedentary lifestyle;obesity (BMI >30kg/m2)     Objective:     Today's Vitals   09/08/23 1645  Weight: 255 lb (115.7 kg)  Height: 5' 11 (1.803 m)  PainSc: 0-No pain   Body mass index is 35.57 kg/m.     09/08/2023    4:49 PM 05/04/2022    4:15 PM 05/03/2022    3:40 PM 09/29/2021    3:14 PM 10/14/2020    9:09 AM 07/25/2020    3:21 AM 07/15/2020    4:34 PM  Advanced Directives  Does Patient Have a Medical Advance Directive? No No No No No No No  Would patient like information on creating a medical advance directive? No - Patient declined No - Patient declined No - Patient declined No - Patient declined No - Patient declined  No - Patient declined    Current Medications (verified) Outpatient Encounter Medications as of 09/08/2023  Medication Sig   clotrimazole  (LOTRIMIN ) 1 % cream Apply 1 Application topically 2 (two) times daily. (Patient not taking: Reported on 09/08/2023)   diclofenac  (VOLTAREN ) 75 MG EC tablet Take 1 tablet (75 mg total) by mouth 2 (two) times daily. (Patient not taking: Reported on 09/08/2023)   predniSONE  (DELTASONE ) 10 MG tablet 6 tabs po day 1, 5 tabs po day 2, 4 tabs po day 3, 3 tabs po day 4, 2 tabs po day 5, 1 tab po day 6 (Patient not taking: Reported on 09/08/2023)   No facility-administered encounter medications on file as of 09/08/2023.    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 No pertinent family history. Social History   Socioeconomic History   Marital status: Divorced    Spouse name: Not on file   Number of children: Not on file   Years of education: Not on file   Highest education level: 9th grade  Occupational History   Occupation: Disability  Tobacco Use   Smoking status: Some Days   Smokeless tobacco: Never   Tobacco comments:    pt reports smoking marijuana occaisionally in [place] for orthopedic pain  Vaping Use   Vaping status: Never Used  Substance and Sexual Activity   Alcohol  use: Not Currently   Drug use: Not Currently   Sexual activity: Not Currently  Other Topics Concern   Not on file  Social History Narrative   Not on file   Social Drivers of Health   Financial Resource Strain: High Risk (09/08/2023)   Overall Financial Resource Strain (CARDIA)    Difficulty of Paying Living Expenses: Hard  Food Insecurity: Food Insecurity Present (09/08/2023)   Hunger Vital Sign    Worried About Running Out of Food in the Last Year: Sometimes true    Ran Out of Food in the Last Year: Sometimes true  Transportation Needs: No Transportation Needs (09/08/2023)   PRAPARE - Administrator, Civil Service (Medical): No    Lack of Transportation (Non-Medical): No  Physical Activity: Inactive (09/08/2023)   Exercise Vital Sign    Days of Exercise per Week: 0 days    Minutes of Exercise per Session: Not on file  Stress: Stress Concern Present (09/08/2023)   [employer] of Occupational Health - Occupational Stress Questionnaire    Feeling of Stress: Rather much  Social Connections: Socially Isolated (09/08/2023)   Social Connection and Isolation Panel    Frequency of Communication with  Friends and Family: Once a week    Frequency of Social Gatherings with Friends and Family: Once a week    Attends Religious Services: Never    Database administrator or Organizations: No    Attends Engineer, structural: Never    Marital Status: Divorced    Tobacco Counseling Ready to quit: Not Answered Counseling given: Not Answered Tobacco comments: pt reports smoking marijuana occaisionally in [place] for orthopedic pain    Clinical Intake:  Pre-visit preparation completed: Yes  Pain : No/denies pain Pain Score: 0-No pain     BMI - recorded: 35.57 Nutritional Status: BMI > 30  Obese Nutritional Risks: None Diabetes: No  Lab Results  Component Value Date   HGBA1C 5.4 09/29/2021     How often do you need to have someone help you when you read instructions, pamphlets, or other written materials from your doctor or pharmacy?: 1 - Never  Interpreter Needed?: No  Information entered by :: Json Koelzer N. Shelle Galdamez, LPN.   Activities of Daily Living  09/08/2023    4:50 PM 09/07/2023    1:23 PM  In your present state of health, do you have any difficulty performing the following activities:  Hearing? 0 0  Vision? 0 0  Difficulty concentrating or making decisions? 0 0  Walking or climbing stairs? 1 1  Dressing or bathing? 0 0  Doing errands, shopping? 0 0  Preparing Food and eating ? N N  Using the Toilet? N N  In the past six months, have you accidently leaked urine? N N  Do you have problems with loss of bowel control? N N  Managing your Medications? N N  Managing your Finances? N N  Housekeeping or managing your Housekeeping? CINDERELLA CINDERELLA    Patient Care Team: Adele Song, MD as PCP - General  I have updated your Care Teams any recent Medical Services you may have received from other providers in the past year.     Assessment:   This is a routine wellness examination for Alexander Black.  Hearing/Vision screen Hearing Screening - Comments:: Denies hearing  difficulties.  Vision Screening - Comments:: No rx glasses - not up to date with routine eye exams.    Goals Addressed             This Visit's Progress    09/08/2023: To feel batter.         Depression Screen     09/08/2023    4:56 PM 05/04/2022    4:14 PM 05/03/2022    3:21 PM 09/29/2021    3:29 PM  PHQ 2/9 Scores  PHQ - 2 Score 0 2 0 1  PHQ- 9 Score 1 4  1     Fall Risk     09/08/2023    4:50 PM 09/07/2023    1:23 PM 05/03/2022    3:21 PM 09/29/2021    3:15 PM  Fall Risk   Falls in the past year? 1 1 0 1  Number falls in past yr: 1 1 0 0  Injury with Fall? 0 0 0 1  Risk for fall due to : History of fall(s);Impaired balance/gait;Orthopedic patient;Impaired mobility  No Fall Risks   Follow up Falls evaluation completed;Education provided  Falls evaluation completed     MEDICARE RISK AT HOME:  Medicare Risk at Home Any stairs in or around the home?: No If so, are there any without handrails?: Yes Home free of loose throw rugs in walkways, pet beds, electrical cords, etc?: Yes Adequate lighting in your home to reduce risk of falls?: Yes Life alert?: No Use of a cane, walker or w/c?: Yes Grab bars in the bathroom?: No Shower chair or bench in shower?: No Elevated toilet seat or a handicapped toilet?: No  TIMED UP AND GO:  Was the test performed?  No  Cognitive Function: 6CIT completed    09/08/2023    4:50 PM  MMSE - Mini Mental State Exam  Not completed: Unable to complete        09/08/2023    4:50 PM 05/03/2022    3:24 PM  6CIT Screen  What Year? 0 points 0 points  What month? 0 points 0 points  What time? 0 points 0 points  Count back from 20 0 points 0 points  Months in reverse 0 points 0 points  Repeat phrase 0 points 0 points  Total Score 0 points 0 points    Immunizations Immunization History  Administered Date(s) Administered   Influenza,inj,Quad PF,6+ Mos 12/10/2021   Influenza-Unspecified 12/07/2018, 12/18/2019  PFIZER Comirnaty(Gray  Top)Covid-19 Tri-Sucrose Vaccine 08/01/2020   PFIZER(Purple Top)SARS-COV-2 Vaccination 06/19/2019, 07/10/2019   Pfizer Covid-19 Vaccine Bivalent Booster 43yrs & up 12/12/2020    Screening Tests Health Maintenance  Topic Date Due   DTaP/Tdap/Td (1 - Tdap) Never done   Pneumococcal Vaccine 22-54 Years old (1 of 2 - PCV) Never done   Hepatitis B Vaccines (1 of 3 - 19+ 3-dose series) Never done   Colonoscopy  Never done   Zoster Vaccines- Shingrix (1 of 2) Never done   COVID-19 Vaccine (5 - 2024-25 season) 11/07/2022   INFLUENZA VACCINE  10/07/2023   Medicare Annual Wellness (AWV)  09/07/2024   Hepatitis C Screening  Completed   HIV Screening  Completed   HPV VACCINES  Aged Out   Meningococcal B Vaccine  Aged Out    Health Maintenance  Health Maintenance Due  Topic Date Due   DTaP/Tdap/Td (1 - Tdap) Never done   Pneumococcal Vaccine 64-34 Years old (1 of 2 - PCV) Never done   Hepatitis B Vaccines (1 of 3 - 19+ 3-dose series) Never done   Colonoscopy  Never done   Zoster Vaccines- Shingrix (1 of 2) Never done   COVID-19 Vaccine (5 - 2024-25 season) 11/07/2022   Health Maintenance Items Addressed: Yes Patient refused all vaccines.  Additional Screening:  Vision Screening: Recommended annual ophthalmology exams for early detection of glaucoma and other disorders of the eye. Would you like a referral to an eye doctor? No    Dental Screening: Recommended annual dental exams for proper oral hygiene  Community Resource Referral / Chronic Care Management: CRR required this visit?  No   CCM required this visit?  No   Plan:    I have personally reviewed and noted the following in the patient's chart:   Medical and social history Use of alcohol , tobacco or illicit drugs  Current medications and supplements including opioid prescriptions. Patient is not currently taking opioid prescriptions. Functional ability and status Nutritional status Physical activity Advanced  directives List of other physicians Hospitalizations, surgeries, and ER visits in previous 12 months Vitals Screenings to include cognitive, depression, and falls Referrals and appointments  In addition, I have reviewed and discussed with patient certain preventive protocols, quality metrics, and best practice recommendations. A written personalized care plan for preventive services as well as general preventive health recommendations were provided to patient.   Roz LOISE Fuller, LPN   04/13/7972   After Visit Summary: (MyChart) Due to this being a telephonic visit, the after visit summary with patients personalized plan was offered to patient via MyChart   Notes: Nothing significant to report at this time.

## 2023-09-08 NOTE — Patient Instructions (Signed)
 Alexander Black , Thank you for taking time out of your busy schedule to complete your Annual Wellness Visit with me. I enjoyed our conversation and look forward to speaking with you again next year. I, as well as your care team,  appreciate your ongoing commitment to your health goals. Please review the following plan we discussed and let me know if I can assist you in the future. Your Game plan/ To Do List    Referrals: If you haven't heard from the office you've been referred to, please reach out to them at the phone provided.   Follow up Visits: Next Medicare AWV with our clinical staff: 09/10/2024 at 4:20 pm phone visit    Have you seen your provider in the last 6 months (3 months if uncontrolled diabetes)? No Next Office Visit with your provider: Office will call patient to schedule  Clinician Recommendations:  Aim for 30 minutes of exercise or brisk walking, 6-8 glasses of water, and 5 servings of fruits and vegetables each day.       This is a list of the screening recommended for you and due dates:  Health Maintenance  Topic Date Due   DTaP/Tdap/Td vaccine (1 - Tdap) Never done   Pneumococcal Vaccination (1 of 2 - PCV) Never done   Hepatitis B Vaccine (1 of 3 - 19+ 3-dose series) Never done   Colon Cancer Screening  Never done   Zoster (Shingles) Vaccine (1 of 2) Never done   COVID-19 Vaccine (5 - 2024-25 season) 11/07/2022   Flu Shot  10/07/2023   Medicare Annual Wellness Visit  09/07/2024   Hepatitis C Screening  Completed   HIV Screening  Completed   HPV Vaccine  Aged Out   Meningitis B Vaccine  Aged Out    Advanced directives: (Declined) Advance directive discussed with you today. Even though you declined this today, please call our office should you change your mind, and we can give you the proper paperwork for you to fill out. Advance Care Planning is important because it:  [x]  Makes sure you receive the medical care that is consistent with your values, goals, and  preferences  [x]  It provides guidance to your family and loved ones and reduces their decisional burden about whether or not they are making the right decisions based on your wishes.  Follow the link provided in your after visit summary or read over the paperwork we have mailed to you to help you started getting your Advance Directives in place. If you need assistance in completing these, please reach out to us  so that we can help you!  See attachments for Preventive Care and Fall Prevention Tips.

## 2023-09-12 NOTE — Progress Notes (Signed)
 Contacted the patient but he did not answer. I was unable to leave a voice mail.

## 2024-09-10 ENCOUNTER — Encounter
